# Patient Record
Sex: Male | Born: 1935 | Race: White | Hispanic: No | Smoking: Never smoker
Health system: Southern US, Community
[De-identification: ages and names within clinical notes are randomized; demographics above are authoritative.]

---

## 2019-04-27 ENCOUNTER — Inpatient Hospital Stay (HOSPITAL_COMMUNITY): Payer: Self-pay

## 2019-04-27 ENCOUNTER — Inpatient Hospital Stay (HOSPITAL_COMMUNITY): Payer: No Typology Code available for payment source

## 2019-04-27 ENCOUNTER — Inpatient Hospital Stay (HOSPITAL_COMMUNITY)
Admission: AD | Admit: 2019-04-27 | Discharge: 2019-05-13 | DRG: 291 | Disposition: A | Discharge status: Skilled Nursing Facility | Payer: No Typology Code available for payment source | Source: Other Acute Inpatient Hospital | Attending: Internal Medicine | Admitting: Internal Medicine

## 2019-04-27 DIAGNOSIS — Z952 Presence of prosthetic heart valve: Secondary | ICD-10-CM | POA: Diagnosis not present

## 2019-04-27 DIAGNOSIS — B948 Sequelae of other specified infectious and parasitic diseases: Secondary | ICD-10-CM | POA: Diagnosis not present

## 2019-04-27 DIAGNOSIS — I130 Hypertensive heart and chronic kidney disease with heart failure and stage 1 through stage 4 chronic kidney disease, or unspecified chronic kidney disease: Principal | ICD-10-CM | POA: Diagnosis present

## 2019-04-27 DIAGNOSIS — R630 Anorexia: Secondary | ICD-10-CM | POA: Diagnosis not present

## 2019-04-27 DIAGNOSIS — Z9181 History of falling: Secondary | ICD-10-CM | POA: Diagnosis not present

## 2019-04-27 DIAGNOSIS — Z6823 Body mass index (BMI) 23.0-23.9, adult: Secondary | ICD-10-CM

## 2019-04-27 DIAGNOSIS — U071 COVID-19: Secondary | ICD-10-CM | POA: Diagnosis present

## 2019-04-27 DIAGNOSIS — I5033 Acute on chronic diastolic (congestive) heart failure: Secondary | ICD-10-CM | POA: Diagnosis present

## 2019-04-27 DIAGNOSIS — Z789 Other specified health status: Secondary | ICD-10-CM | POA: Diagnosis not present

## 2019-04-27 DIAGNOSIS — F0390 Unspecified dementia without behavioral disturbance: Secondary | ICD-10-CM | POA: Diagnosis present

## 2019-04-27 DIAGNOSIS — I2510 Atherosclerotic heart disease of native coronary artery without angina pectoris: Secondary | ICD-10-CM | POA: Diagnosis present

## 2019-04-27 DIAGNOSIS — I350 Nonrheumatic aortic (valve) stenosis: Secondary | ICD-10-CM | POA: Diagnosis not present

## 2019-04-27 DIAGNOSIS — J1282 Pneumonia due to coronavirus disease 2019: Secondary | ICD-10-CM | POA: Diagnosis present

## 2019-04-27 DIAGNOSIS — Z79899 Other long term (current) drug therapy: Secondary | ICD-10-CM | POA: Diagnosis not present

## 2019-04-27 DIAGNOSIS — E7800 Pure hypercholesterolemia, unspecified: Secondary | ICD-10-CM | POA: Diagnosis present

## 2019-04-27 DIAGNOSIS — K8010 Calculus of gallbladder with chronic cholecystitis without obstruction: Secondary | ICD-10-CM | POA: Diagnosis present

## 2019-04-27 DIAGNOSIS — Z7901 Long term (current) use of anticoagulants: Secondary | ICD-10-CM

## 2019-04-27 DIAGNOSIS — Z7982 Long term (current) use of aspirin: Secondary | ICD-10-CM | POA: Diagnosis not present

## 2019-04-27 DIAGNOSIS — I482 Chronic atrial fibrillation, unspecified: Secondary | ICD-10-CM | POA: Diagnosis present

## 2019-04-27 DIAGNOSIS — M1990 Unspecified osteoarthritis, unspecified site: Secondary | ICD-10-CM | POA: Diagnosis present

## 2019-04-27 DIAGNOSIS — Z66 Do not resuscitate: Secondary | ICD-10-CM | POA: Diagnosis present

## 2019-04-27 DIAGNOSIS — E785 Hyperlipidemia, unspecified: Secondary | ICD-10-CM | POA: Diagnosis present

## 2019-04-27 DIAGNOSIS — N1831 Chronic kidney disease, stage 3a: Secondary | ICD-10-CM | POA: Diagnosis present

## 2019-04-27 DIAGNOSIS — E876 Hypokalemia: Secondary | ICD-10-CM | POA: Diagnosis not present

## 2019-04-27 DIAGNOSIS — I509 Heart failure, unspecified: Secondary | ICD-10-CM | POA: Diagnosis not present

## 2019-04-27 DIAGNOSIS — K8020 Calculus of gallbladder without cholecystitis without obstruction: Secondary | ICD-10-CM | POA: Diagnosis not present

## 2019-04-27 DIAGNOSIS — J1289 Other viral pneumonia: Secondary | ICD-10-CM | POA: Diagnosis not present

## 2019-04-27 DIAGNOSIS — N183 Chronic kidney disease, stage 3 unspecified: Secondary | ICD-10-CM | POA: Diagnosis not present

## 2019-04-27 DIAGNOSIS — I50811 Acute right heart failure: Secondary | ICD-10-CM

## 2019-04-27 DIAGNOSIS — R0600 Dyspnea, unspecified: Secondary | ICD-10-CM

## 2019-04-27 DIAGNOSIS — R627 Adult failure to thrive: Secondary | ICD-10-CM | POA: Diagnosis present

## 2019-04-27 DIAGNOSIS — Z79891 Long term (current) use of opiate analgesic: Secondary | ICD-10-CM | POA: Diagnosis not present

## 2019-04-27 DIAGNOSIS — N179 Acute kidney failure, unspecified: Secondary | ICD-10-CM | POA: Diagnosis not present

## 2019-04-27 DIAGNOSIS — R0602 Shortness of breath: Secondary | ICD-10-CM | POA: Diagnosis not present

## 2019-04-27 LAB — CBC
HCT: 33 % — ABNORMAL LOW (ref 39.0–52.0)
Hemoglobin: 10.6 g/dL — ABNORMAL LOW (ref 13.0–17.0)
MCH: 29.8 pg (ref 26.0–34.0)
MCHC: 32.1 g/dL (ref 30.0–36.0)
MCV: 92.7 fL (ref 80.0–100.0)
Platelets: 309 10*3/uL (ref 150–400)
RBC: 3.56 MIL/uL — ABNORMAL LOW (ref 4.22–5.81)
RDW: 15 % (ref 11.5–15.5)
WBC: 15.8 10*3/uL — ABNORMAL HIGH (ref 4.0–10.5)
nRBC: 0 % (ref 0.0–0.2)

## 2019-04-27 LAB — COMPREHENSIVE METABOLIC PANEL
ALT: 36 U/L (ref 0–44)
AST: 37 U/L (ref 15–41)
Albumin: 2.6 g/dL — ABNORMAL LOW (ref 3.5–5.0)
Alkaline Phosphatase: 198 U/L — ABNORMAL HIGH (ref 38–126)
Anion gap: 10 (ref 5–15)
BUN: 17 mg/dL (ref 8–23)
CO2: 22 mmol/L (ref 22–32)
Calcium: 8.8 mg/dL — ABNORMAL LOW (ref 8.9–10.3)
Chloride: 108 mmol/L (ref 98–111)
Creatinine, Ser: 1.18 mg/dL (ref 0.61–1.24)
GFR calc Af Amer: >60 mL/min (ref >60)
GFR calc non Af Amer: 54 mL/min — ABNORMAL LOW (ref >60)
Glucose, Bld: 128 mg/dL — ABNORMAL HIGH (ref 70–99)
Potassium: 3.8 mmol/L (ref 3.5–5.1)
Sodium: 140 mmol/L (ref 135–145)
Total Bilirubin: 1 mg/dL (ref 0.3–1.2)
Total Protein: 7.7 g/dL (ref 6.5–8.1)

## 2019-04-27 LAB — D-DIMER, QUANTITATIVE: D-Dimer, Quant: 3.89 ug/mL-FEU — ABNORMAL HIGH (ref 0.00–0.50)

## 2019-04-27 LAB — ABO/RH: ABO/RH(D): O POS

## 2019-04-27 LAB — C-REACTIVE PROTEIN: CRP: 11.1 mg/dL — ABNORMAL HIGH (ref <1.0)

## 2019-04-27 LAB — PROTIME-INR
INR: 2.6 — ABNORMAL HIGH (ref 0.8–1.2)
Prothrombin Time: 27.6 seconds — ABNORMAL HIGH (ref 11.4–15.2)

## 2019-04-27 LAB — FERRITIN: Ferritin: 489 ng/mL — ABNORMAL HIGH (ref 24–336)

## 2019-04-27 MED ORDER — NITROGLYCERIN 0.4 MG SL SUBL
0.40 | SUBLINGUAL_TABLET | SUBLINGUAL | Status: DC
Start: ? — End: 2019-04-27

## 2019-04-27 MED ORDER — ONDANSETRON HCL 4 MG/2ML IJ SOLN
4.00 | INTRAMUSCULAR | Status: DC
Start: ? — End: 2019-04-27

## 2019-04-27 MED ORDER — PANTOPRAZOLE SODIUM 40 MG PO TBEC
40.0000 mg | DELAYED_RELEASE_TABLET | Freq: Every day | ORAL | Status: DC
  Administered 2019-04-27 – 2019-05-12 (×14): 40 mg via ORAL
  Filled 2019-04-27 (×18): qty 1

## 2019-04-27 MED ORDER — DOCUSATE SODIUM 100 MG PO CAPS
100.00 | ORAL_CAPSULE | ORAL | Status: DC
Start: ? — End: 2019-04-27

## 2019-04-27 MED ORDER — METOPROLOL SUCCINATE ER 50 MG PO TB24
50.00 | ORAL_TABLET | ORAL | Status: DC
Start: 2019-04-26 — End: 2019-04-27

## 2019-04-27 MED ORDER — ALBUTEROL SULFATE HFA 108 (90 BASE) MCG/ACT IN AERS
2.00 | INHALATION_SPRAY | RESPIRATORY_TRACT | Status: DC
Start: ? — End: 2019-04-27

## 2019-04-27 MED ORDER — NAPHAZOLINE HCL OP
2.00 | OPHTHALMIC | Status: DC
Start: ? — End: 2019-04-27

## 2019-04-27 MED ORDER — OXYCODONE-ACETAMINOPHEN 5-325 MG PO TABS
1.00 | ORAL_TABLET | ORAL | Status: DC
Start: ? — End: 2019-04-27

## 2019-04-27 MED ORDER — PANTOPRAZOLE SODIUM 40 MG IV SOLR
40.00 | INTRAVENOUS | Status: DC
Start: 2019-04-25 — End: 2019-04-27

## 2019-04-27 MED ORDER — GABAPENTIN 100 MG PO CAPS
200.00 | ORAL_CAPSULE | ORAL | Status: DC
Start: 2019-04-25 — End: 2019-04-27

## 2019-04-27 MED ORDER — HYDROCOD POLST-CPM POLST ER 10-8 MG/5ML PO SUER
5.0000 mL | Freq: Two times a day (BID) | ORAL | Status: DC
  Administered 2019-04-27 – 2019-05-09 (×16): 5 mL via ORAL
  Filled 2019-04-27 (×22): qty 5

## 2019-04-27 MED ORDER — GENERIC EXTERNAL MEDICATION
500.00 | Status: DC
Start: 2019-04-25 — End: 2019-04-27

## 2019-04-27 MED ORDER — GUAIFENESIN-DM 100-10 MG/5ML PO SYRP: 10.0000 mL | ORAL_SOLUTION | ORAL | Status: DC | PRN

## 2019-04-27 MED ORDER — ZINC SULFATE 220 (50 ZN) MG PO CAPS
220.0000 mg | ORAL_CAPSULE | Freq: Every day | ORAL | Status: DC
  Administered 2019-04-27 – 2019-05-12 (×15): 220 mg via ORAL
  Filled 2019-04-27 (×18): qty 1

## 2019-04-27 MED ORDER — ROSUVASTATIN CALCIUM 20 MG PO TABS
20.0000 mg | ORAL_TABLET | Freq: Every day | ORAL | Status: DC
  Administered 2019-04-27 – 2019-05-12 (×16): 20 mg via ORAL
  Filled 2019-04-27 (×9): qty 1
  Filled 2019-04-27: qty 4
  Filled 2019-04-27 (×7): qty 1

## 2019-04-27 MED ORDER — ONDANSETRON HCL 4 MG/2ML IJ SOLN: 4.0000 mg | Freq: Four times a day (QID) | INTRAMUSCULAR | Status: DC | PRN

## 2019-04-27 MED ORDER — VITAMIN C 500 MG PO TABS
500.0000 mg | ORAL_TABLET | Freq: Every day | ORAL | Status: DC
  Administered 2019-04-27 – 2019-05-13 (×16): 500 mg via ORAL
  Filled 2019-04-27 (×18): qty 1

## 2019-04-27 MED ORDER — GENERIC EXTERNAL MEDICATION
1.00 | Status: DC
Start: 2019-04-26 — End: 2019-04-27

## 2019-04-27 MED ORDER — HYDROCODONE-ACETAMINOPHEN 5-325 MG PO TABS
1.00 | ORAL_TABLET | ORAL | Status: DC
Start: ? — End: 2019-04-27

## 2019-04-27 MED ORDER — ARIPIPRAZOLE 10 MG PO TABS
10.00 | ORAL_TABLET | ORAL | Status: DC
Start: 2019-04-25 — End: 2019-04-27

## 2019-04-27 MED ORDER — ENOXAPARIN SODIUM 40 MG/0.4ML ~~LOC~~ SOLN: 40.0000 mg | SUBCUTANEOUS | Status: DC

## 2019-04-27 MED ORDER — ACETAMINOPHEN 325 MG PO TABS
650.0000 mg | ORAL_TABLET | Freq: Four times a day (QID) | ORAL | Status: DC | PRN
  Administered 2019-04-30 – 2019-05-11 (×5): 650 mg via ORAL
  Filled 2019-04-27 (×6): qty 2

## 2019-04-27 MED ORDER — ASPIRIN EC 81 MG PO TBEC
81.0000 mg | DELAYED_RELEASE_TABLET | Freq: Every day | ORAL | Status: DC
  Administered 2019-04-27 – 2019-05-13 (×16): 81 mg via ORAL
  Filled 2019-04-27 (×18): qty 1

## 2019-04-27 MED ORDER — ASPIRIN EC 81 MG PO TBEC
81.00 | DELAYED_RELEASE_TABLET | ORAL | Status: DC
Start: 2019-04-26 — End: 2019-04-27

## 2019-04-27 MED ORDER — ARIPIPRAZOLE 10 MG PO TABS
10.0000 mg | ORAL_TABLET | Freq: Every day | ORAL | Status: DC
  Administered 2019-04-27 – 2019-05-12 (×16): 10 mg via ORAL
  Filled 2019-04-27 (×17): qty 1

## 2019-04-27 MED ORDER — WARFARIN SODIUM 5 MG PO TABS
5.00 | ORAL_TABLET | ORAL | Status: DC
Start: 2019-04-25 — End: 2019-04-27

## 2019-04-27 MED ORDER — ACETAMINOPHEN 650 MG RE SUPP: 650.0000 mg | Freq: Four times a day (QID) | RECTAL | Status: DC | PRN

## 2019-04-27 MED ORDER — METOPROLOL SUCCINATE ER 25 MG PO TB24
75.0000 mg | ORAL_TABLET | Freq: Every day | ORAL | Status: DC
  Administered 2019-04-28 – 2019-05-13 (×15): 75 mg via ORAL
  Filled 2019-04-27 (×19): qty 3

## 2019-04-27 MED ORDER — FUROSEMIDE 10 MG/ML IJ SOLN
40.0000 mg | Freq: Two times a day (BID) | INTRAMUSCULAR | Status: DC
  Administered 2019-04-27 – 2019-04-28 (×2): 40 mg via INTRAVENOUS
  Filled 2019-04-27 (×2): qty 4

## 2019-04-27 MED ORDER — ONDANSETRON HCL 4 MG PO TABS: 4.0000 mg | ORAL_TABLET | Freq: Four times a day (QID) | ORAL | Status: DC | PRN

## 2019-04-27 MED ORDER — ACETAMINOPHEN 325 MG PO TABS
650.00 | ORAL_TABLET | ORAL | Status: DC
Start: ? — End: 2019-04-27

## 2019-04-27 MED ORDER — ONDANSETRON HCL 4 MG PO TABS
4.00 | ORAL_TABLET | ORAL | Status: DC
Start: ? — End: 2019-04-27

## 2019-04-27 MED ORDER — CLONIDINE HCL 0.1 MG PO TABS
0.10 | ORAL_TABLET | ORAL | Status: DC
Start: ? — End: 2019-04-27

## 2019-04-27 MED ORDER — SACCHAROMYCES BOULARDII 250 MG PO CAPS
250.00 | ORAL_CAPSULE | ORAL | Status: DC
Start: 2019-04-25 — End: 2019-04-27

## 2019-04-27 MED ORDER — ZINC SULFATE 220 (50 ZN) MG PO CAPS
220.00 | ORAL_CAPSULE | ORAL | Status: DC
Start: 2019-04-25 — End: 2019-04-27

## 2019-04-27 MED ORDER — DEXAMETHASONE 6 MG PO TABS: 6.0000 mg | ORAL_TABLET | ORAL | Status: DC

## 2019-04-27 NOTE — H&P (Signed)
History and Physical    Hartford Maulden UMP:[id number] DOB: 08-Jul-1926 DOA: 04/27/2019  PCP: Melony Overly, MD   Patient coming from: [facility] ED  Chief Complaint:  Dyspnea.   HPI: Marcus Quinn is a 85 y.o. male with medical history significant of chronic atrial fibrillation, dyslipidemia, coronary artery disease, and aortic stenosis status post TAVR.  He was diagnosed with COVID-09 February 2019, apparently patient was admitted to the hospital and discharged to skilled nursing facility.  He reports being at home for about a week and a half, feeling fatigued and very tired.  Positive dyspnea.  He usually uses a cane for ambulation.  He lives alone, noted progressive and rapid decline in his functional physical capacity.  Home health agency evaluated him on the day of admission found him very weak and deconditioned and refer him to the hospital for further evaluation. On direct questioning patient admits having moderate dyspnea, persistent, worse with exertion, no improving factors, associated with lower extremity edema and decreased physical functional capacity.  Denies any fevers and chills.  ED Course: Further work-up in the emergency department revealed oxygen saturation 80% on room air, CT angiography of his chest negative for pulmonary embolism or significant infiltrates, positive leukocytosis and positive SARS COVID-19 PCR  Review of Systems:  1. General: No fevers, no chills, no weight gain or weight loss 2. ENT: No runny nose or sore throat, no hearing disturbances 3. Pulmonary: positive dyspnea, but no cough, wheezing, or hemoptysis 4. Cardiovascular: No angina, claudication, positive lower extremity edema, but no pnd or orthopnea 5. Gastrointestinal: No nausea or vomiting, no diarrhea or constipation 6. Hematology: No easy bruisability or frequent infections 7. Urology: No dysuria, hematuria or increased urinary frequency 8. Dermatology: No rashes. 9. Neurology: No seizures or  paresthesias, generalized weakness and ambulatory dysfunction.  10. Musculoskeletal: No joint pain or deformities   Past Medical and Concurrent History Cardiac History: Reports: Coronary Artery Disease, Atrial Fibrillation, Hypertension, Hypercholesterolemia, Valvular Heart Disease.  Denies: Syncope Respiratory History: Denies: Asthma, COPD, Pulmonary Embolism GI/GU History: Reports: Gastroesophageal Reflux.  Denies: Renal Disease, Kidney Stones, Ulcer, IBD Musculoskeletal History: Reports: Osteoarthritis Neurological History: Denies: Cerebrovascular Accident, Seizures, Migraine Psychological History: Denies: Depression    Prior to Admission medications   Not on File    Physical Exam: There were no vitals filed for this visit.  There were no vitals filed for this visit. General: deconditioned  Neurology: Awake and alert, non focal Head and Neck. Head normocephalic. Neck supple with no adenopathy or thyromegaly.   E ENT: mild pallor, no icterus, oral mucosa moist Cardiovascular: No JVD. S1-S2 present, irregularly irregular, no gallops, rubs, or murmurs. ++/+++ pitting lower extremity edema. Pulmonary: positive breath sounds bilaterally. Gastrointestinal. Abdomen flat, no organomegaly, non tender, no rebound or guarding Skin. No rashes Musculoskeletal: no joint deformities    Labs on Admission: I have personally reviewed following labs and imaging studies  CBC: No results for input(s): WBC, NEUTROABS, HGB, HCT, MCV, PLT in the last 168 hours. Basic Metabolic Panel: No results for input(s): NA, K, CL, CO2, GLUCOSE, BUN, CREATININE, CALCIUM, MG, PHOS in the last 168 hours. GFR: CrCl cannot be calculated (No successful lab value found.). Liver Function Tests: No results for input(s): AST, ALT, ALKPHOS, BILITOT, PROT, ALBUMIN in the last 168 hours. No results for input(s): LIPASE, AMYLASE in the last 168 hours. No results for input(s): AMMONIA in the last 168 hours.  Coagulation Profile: No results for input(s): INR, PROTIME in the last 168 hours.  Cardiac Enzymes: No results for input(s): CKTOTAL, CKMB, CKMBINDEX, TROPONINI in the last 168 hours. BNP (last 3 results) No results for input(s): PROBNP in the last 8760 hours. HbA1C: No results for input(s): HGBA1C in the last 72 hours. CBG: No results for input(s): GLUCAP in the last 168 hours. Lipid Profile: No results for input(s): CHOL, HDL, LDLCALC, TRIG, CHOLHDL, LDLDIRECT in the last 72 hours. Thyroid Function Tests: No results for input(s): TSH, T4TOTAL, FREET4, T3FREE, THYROIDAB in the last 72 hours. Anemia Panel: No results for input(s): VITAMINB12, FOLATE, FERRITIN, TIBC, IRON, RETICCTPCT in the last 72 hours. Urine analysis: No results found for: COLORURINE, APPEARANCEUR, LABSPEC, PHURINE, GLUCOSEU, HGBUR, BILIRUBINUR, KETONESUR, PROTEINUR, UROBILINOGEN, NITRITE, LEUKOCYTESUR  Radiological Exams on Admission: No results found.  EKG: Independently reviewed. NA    Home Medications  Aripiprazole [Abilify] 10 mg PO HS 02/11/19 [Confirmed 02/11/19 Last Taken 02/10/19] Aspirin Mendel Ryder Aspirin] 81 mg PO QAM 02/11/19 [Confirmed 02/11/19 Last Taken 02/11/19] Famotidine [Pepcid] 40 mg PO DAILY #30 tablet 02/11/19 [Last Taken Unknown] Furosemide 20 mg PO QAM 02/11/19 [Confirmed 02/11/19 Last Taken 02/11/19] Gabapentin 300 mg PO TID 02/11/19 [Confirmed 02/11/19 Last Taken 02/11/19] Metoprolol Succinate 75 mg PO QAM 02/11/19 [Confirmed 02/11/19 Last Taken 02/11/19] Multivitamin [Daily Multiple Vitamin] 1 tab PO DAILY 02/11/19 [Confirmed 02/11/19 Last Taken 02/11/19] Pantoprazole Sodium 40 mg PO DAILY 02/11/19 [Confirmed 02/11/19 Last Taken 02/11/19] Rosuvastatin Calcium 20 mg PO HS 02/11/19 [Confirmed 02/11/19 Last Taken 02/11/19] Sennosides/Docusate Sodium [Senna-Docusate Sodium Tablet] 2 tab PO HS PRN 02/11/19 [Confirmed 02/11/19 Last Taken Unknown] Warfarin Sodium [Coumadin] 5 mg PO  .QEVENING 02/11/19 [Confirmed 02/11/19 Last Taken 02/10/19  Family history was reviewed and found to be non contributory.   Assessment/Plan Principal Problem:   Acute CHF (congestive heart failure) (HCC) Active Problems:   Pneumonia due to COVID-19 virus   Atrial fibrillation, chronic (HCC)   Aortic stenosis  85 year old male past medical history of chronic atrial fibrillation, aortic stenosis (TAVR), dyslipidemia, coronary artery disease.  Recently diagnosed with COVID-09 February 2019, apparently he has been at a skilled nursing facility and discharged posteriorly home.  At his home he lives alone, he experienced a rapid decline in his physical functional capacity associated with dyspnea and lower extremity edema.  No fevers no chills, no cough.  Patient tested for positive for COVID-19 again today and was transferred to [facility], at the time of transfer his temperature 98.5, blood pressure 136/86, heart rate 98, respiratory rate 25, oxygen saturation 97% on room air.  His lungs had no wheezing or rales, heart S1-S2 present, irregularly irregular, abdomen soft, positive lower extremity edema.  Sodium 143, potassium 4.1, chloride 110, bicarb 25, BUN 20, creatinine 1.20, glucose 140, AST 47, ALT 39, alkaline phosphatase 249, total bilirubin is 1.0, troponin 0 0.06, procalcitonin 0.56. White cell count 12.9, hemoglobin 10.6, hematocrit 30.8, platelets 278. CT chest with bilateral pleural effusions,) left.  Abnormal appearance of the gallbladder and gallbladder..  Possible acute cholecystitis.  Patient was admitted to the hospital with working diagnosis right heart failure, acute on chronic, in the setting of positive COVID-19 PCR.   1.  Acute on chronic diastolic heart failure with cardiogenic pulmonary edema and bilateral pleural effusions.  Patient will be admitted to the telemetry ward, continue diuresis with furosemide 40 g intravenously every 12 hours, target negative fluid balance.  Continue  beta-blockade with metoprolol succinate.  Follow-up on echocardiography report.  2.  COVID 19 infection. Patient currently has no symptoms of COVID viral pneumonia, he tested positive  for the first time in 08/20, after treatment he was retested and resulted negative, now on current encounter he has tested positive again. Will continue respiratory isolation for now.   3. Dyslipidemia. Continue with statin therapy.   4. Chronic atrial fibrillation. Currently rate control, continue metoprolol and anticoagulation with warfarin.   5. Dementia. Continue with ariprazole.    6. Abnormal gallbladder. Will follow up with ruq US. Patient with no abdominal pain, no nausea or vomiting.    DVT prophylaxis:  Warfarin  Code Status: full  Family Communication: no family at the bedside   Disposition Plan:  Telemetry   Consults called: none   Admission status: Inpatient.     Marcus Annett Gulaaniel Arrien MD [facility]   04/27/2019, 3:02 PM

## 2019-04-27 NOTE — Progress Notes (Addendum)
ANTICOAGULATION CONSULT NOTE - Initial Consult  Pharmacy Consult for Warfarin Indication: atrial fibrillation  Not on File  Patient Measurements:   Heparin Dosing Weight:   Vital Signs: Temp: 98.5 F (36.9 C) (11/04 1500) Temp Source: Oral (11/04 1500) BP: 136/86 (11/04 1500) Pulse Rate: 98 (11/04 1500)  Labs: No results for input(s): HGB, HCT, PLT, APTT, LABPROT, INR, HEPARINUNFRC, HEPRLOWMOCWT, CREATININE, CKTOTAL, CKMB, TROPONINIHS in the last 72 hours.  CrCl cannot be calculated (No successful lab value found.).   Medical History: No past medical history on file.  Medications:  Scheduled:  . ARIPiprazole  10 mg Oral QHS  . aspirin EC  81 mg Oral Daily  . chlorpheniramine-HYDROcodone  5 mL Oral Q12H  . enoxaparin (LOVENOX) injection  40 mg Subcutaneous Q24H  . furosemide  40 mg Intravenous Q12H  . metoprolol succinate  75 mg Oral Daily  . pantoprazole  40 mg Oral Daily  . rosuvastatin  20 mg Oral q1800  . vitamin C  500 mg Oral Daily  . zinc sulfate  220 mg Oral Daily   Infusions:    Assessment: 89 yoM admitted to [place] from [facility] with +COVID-19.  PMH is significant for chronic Afib on warfarin, dyslipidemia, CAD, andaortic stenosis s/p TAVR.  Pharmacy has been consulted to continue warfarin dosing.  Med rec is pending for home dose. INR yesterday at [facility] was 2.3, INR today is pending. CBC from [facility] 11/3:  Hgb 10.6, Plt Count 278 No bleeding or complications reported.  No major drug-drug interactions identified.   Goal of Therapy:  INR 2-3 Monitor platelets by anticoagulation protocol: Yes   Plan:  Warfarin dose pending updated information. Daily PT/INR. Monitor for signs and symptoms of bleeding.  Gretta Arab PharmD, BCPS Clinical pharmacist phone 7am- 5pm: [phone] 04/27/2019 4:54 PM

## 2019-04-28 ENCOUNTER — Encounter (HOSPITAL_COMMUNITY): Payer: Self-pay

## 2019-04-28 ENCOUNTER — Inpatient Hospital Stay (HOSPITAL_COMMUNITY): Payer: No Typology Code available for payment source

## 2019-04-28 DIAGNOSIS — R0602 Shortness of breath: Secondary | ICD-10-CM

## 2019-04-28 LAB — COMPREHENSIVE METABOLIC PANEL
ALT: 35 U/L (ref 0–44)
AST: 42 U/L — ABNORMAL HIGH (ref 15–41)
Albumin: 2.7 g/dL — ABNORMAL LOW (ref 3.5–5.0)
Alkaline Phosphatase: 193 U/L — ABNORMAL HIGH (ref 38–126)
Anion gap: 12 (ref 5–15)
BUN: 19 mg/dL (ref 8–23)
CO2: 24 mmol/L (ref 22–32)
Calcium: 8.6 mg/dL — ABNORMAL LOW (ref 8.9–10.3)
Chloride: 105 mmol/L (ref 98–111)
Creatinine, Ser: 1.29 mg/dL — ABNORMAL HIGH (ref 0.61–1.24)
GFR calc Af Amer: 56 mL/min — ABNORMAL LOW (ref >60)
GFR calc non Af Amer: 48 mL/min — ABNORMAL LOW (ref >60)
Glucose, Bld: 104 mg/dL — ABNORMAL HIGH (ref 70–99)
Potassium: 3.3 mmol/L — ABNORMAL LOW (ref 3.5–5.1)
Sodium: 141 mmol/L (ref 135–145)
Total Bilirubin: 1.7 mg/dL — ABNORMAL HIGH (ref 0.3–1.2)
Total Protein: 7.3 g/dL (ref 6.5–8.1)

## 2019-04-28 LAB — C-REACTIVE PROTEIN: CRP: 10.2 mg/dL — ABNORMAL HIGH (ref <1.0)

## 2019-04-28 LAB — ECHOCARDIOGRAM COMPLETE

## 2019-04-28 LAB — PROTIME-INR
INR: 2.6 — ABNORMAL HIGH (ref 0.8–1.2)
Prothrombin Time: 27.5 seconds — ABNORMAL HIGH (ref 11.4–15.2)

## 2019-04-28 LAB — D-DIMER, QUANTITATIVE: D-Dimer, Quant: 6.7 ug/mL-FEU — ABNORMAL HIGH (ref 0.00–0.50)

## 2019-04-28 LAB — FERRITIN: Ferritin: 480 ng/mL — ABNORMAL HIGH (ref 24–336)

## 2019-04-28 MED ORDER — WARFARIN SODIUM 3 MG PO TABS
3.0000 mg | ORAL_TABLET | Freq: Once | ORAL | Status: AC
  Administered 2019-04-28: 3 mg via ORAL
  Filled 2019-04-28: qty 1

## 2019-04-28 MED ORDER — WARFARIN - PHARMACIST DOSING INPATIENT
Freq: Every day | Status: DC
  Administered 2019-05-05 – 2019-05-09 (×5)

## 2019-04-28 NOTE — Progress Notes (Signed)
ANTICOAGULATION CONSULT NOTE - Follow Up Consult  Pharmacy Consult for Warfarin Indication: atrial fibrillation  No Known Allergies  Patient Measurements:    Vital Signs: Temp: 97.7 F (36.5 C) (11/05 1100) Temp Source: Oral (11/05 1100) BP: 135/91 (11/05 1100) Pulse Rate: 100 (11/05 0850)  Labs: Recent Labs    04/27/19 1600 04/28/19 0600  HGB 10.6*  --   HCT 33.0*  --   PLT 309  --   LABPROT 27.6* 27.5*  INR 2.6* 2.6*  CREATININE 1.18 1.29*    CrCl cannot be calculated (Unknown ideal weight.).   Medications:  Scheduled:  . ARIPiprazole  10 mg Oral QHS  . aspirin EC  81 mg Oral Daily  . chlorpheniramine-HYDROcodone  5 mL Oral Q12H  . metoprolol succinate  75 mg Oral Daily  . pantoprazole  40 mg Oral Daily  . rosuvastatin  20 mg Oral q1800  . vitamin C  500 mg Oral Daily  . zinc sulfate  220 mg Oral Daily   Infusions:    Assessment: Marcus Quinn admitted to [place] from [facility] with +COVID-19.  PMH is significant for chronic Afib on warfarin, dyslipidemia, CAD, andaortic stenosis s/p TAVR.  Pharmacy has been consulted to continue warfarin dosing.  PTA warfarin dose is 5mg  daily.  INR 2.6 CBC: Hgb 10.6, Plt wnl. No bleeding or complications reported.  No major drug-drug interactions identified. D-dimer 3.89 > 6.7  Goal of Therapy:  INR 2-3 Monitor platelets by anticoagulation protocol: Yes   Plan:  Warfarin 3 mg PO x 1. Daily PT/INR. Monitor for signs and symptoms of bleeding.   Gretta Arab PharmD, BCPS Clinical pharmacist phone 7am- 5pm: [phone] 04/28/2019 12:17 PM

## 2019-04-28 NOTE — Progress Notes (Signed)
  Echocardiogram 2D Echocardiogram has been performed.  Marcus Quinn 04/28/2019, 11:25 AM

## 2019-04-28 NOTE — Progress Notes (Signed)
Nurse inquired if wanted to call daughter and update her and he stated no he did not want for nurse to do this at this time.

## 2019-04-28 NOTE — Progress Notes (Signed)
PROGRESS NOTE    Marcus Quinn  VQQ:[id number] DOB: Jan 08, 1927 DOA: 04/27/2019 PCP: Melony Overly, MD    Brief Narrative:  85 year old male past medical history of chronic atrial fibrillation, aortic stenosis (TAVR), dyslipidemia, coronary artery disease.  Recently diagnosed with COVID-09 February 2019, apparently he has been at a skilled nursing facility and discharged posteriorly home.  At his home he lives alone, he experienced a rapid decline in his physical functional capacity associated with dyspnea and lower extremity edema.  No fevers no chills, no cough.  Patient tested for positive for COVID-19 again today and was transferred to [facility], at the time of transfer his temperature 98.5, blood pressure 136/86, heart rate 98, respiratory rate 25, oxygen saturation 97% on room air.  His lungs had no wheezing or rales, heart S1-S2 present, irregularly irregular, abdomen soft, positive lower extremity edema.  Sodium 143, potassium 4.1, chloride 110, bicarb 25, BUN 20, creatinine 1.20, glucose 140, AST 47, ALT 39, alkaline phosphatase 249, total bilirubin is 1.0, troponin 0 0.06, procalcitonin 0.56. White cell count 12.9, hemoglobin 10.6, hematocrit 30.8, platelets 278. CT chest with bilateral pleural effusions,) left.  Abnormal appearance of the gallbladder and gallbladder..  Possible acute cholecystitis.  Patient was admitted to the hospital with working diagnosis right heart failure, acute on chronic, in the setting of positive COVID-19 PCR.    Assessment & Plan:   Principal Problem:   Acute CHF (congestive heart failure) (HCC) Active Problems:   Pneumonia due to COVID-19 virus   Atrial fibrillation, chronic (HCC)   Aortic stenosis   1.  Acute on chronic diastolic heart failure with cardiogenic pulmonary edema and bilateral pleural effusions. Chest film personally reviewed noted no significant edema or pleural effusions, fluid balance is negative 2,155 ml over last 24 H. Dyspnea has improved,  with oxymetry at 97% on room air. Will hold on furosemide today and will continue to follow symptoms. Out of bed with physical therapy. Continue with metoprolol and follow with echocardiogram.   2.  COVID 19 infection. Initial positive PCR on 08/20. No clinical signs of viral pneumonia. Elevated D dimer 6,7 but CT scan at [facility] negative for PE. Patient on warfarin for atrial fibrillation. INR is therapeutic.   3. Dyslipidemia. On statin therapy with rosuvastatin.    4. Chronic atrial fibrillation. On metoprolol for rate control and  anticoagulation with warfarin.   5. Dementia. On ariprazole.    6. Abnormal gallbladder. Pending [place] right upper quadrant, but patient with no abdominal pain.    DVT prophylaxis: warfarin   Code Status: full Family Communication: no family at the bedside  Disposition Plan/ discharge barriers: possible dc home in am, pending physical therapy evaluation.   There is no height or weight on file to calculate BMI. Malnutrition Type:      Malnutrition Characteristics:      Nutrition Interventions:     RN Pressure Injury Documentation:     Consultants:     Procedures:     Antimicrobials:       Subjective: Patient is feeling better, his dyspnea has improved, no nausea or vomiting, no chest pain. Has not been out of bed yet. At home was very weak and deconditioned.   Objective: Vitals:   04/28/19 0039 04/28/19 0700 04/28/19 0802 04/28/19 0850  BP: (!) 128/95  (!) 144/87 (!) 144/87  Pulse:    100  Resp: 19  16 (!) 21  Temp: 98 F (36.7 C) (!) 97.3 F (36.3 C)    TempSrc:  Oral Oral    SpO2: 100%  97% 97%    Intake/Output Summary (Last 24 hours) at 04/28/2019 2130 Last data filed at 04/28/2019 0000 Gross per 24 hour  Intake 120 ml  Output 2275 ml  Net -2155 ml   There were no vitals filed for this visit.  Examination:   General: Not in pain or dyspnea, deconditioned  Neurology: Awake and alert, non focal  E ENT: with mild  pallor, no icterus, oral mucosa moist Cardiovascular: No JVD. S1-S2 present, rhythmic, no gallops, rubs, or murmurs. Trace lower extremity edema. Pulmonary: bilateral breath sounds bilaterally. Gastrointestinal. Abdomen with no organomegaly, non tender, no rebound or guarding Skin. No rashes Musculoskeletal: no joint deformities     Data Reviewed: I have personally reviewed following labs and imaging studies  CBC: Recent Labs  Lab 04/27/19 1600  WBC 15.8*  HGB 10.6*  HCT 33.0*  MCV 92.7  PLT 309   Basic Metabolic Panel: Recent Labs  Lab 04/27/19 1600 04/28/19 0600  NA 140 141  K 3.8 3.3*  CL 108 105  CO2 22 24  GLUCOSE 128* 104*  BUN 17 19  CREATININE 1.18 1.29*  CALCIUM 8.8* 8.6*   GFR: CrCl cannot be calculated (Unknown ideal weight.). Liver Function Tests: Recent Labs  Lab 04/27/19 1600 04/28/19 0600  AST 37 42*  ALT 36 35  ALKPHOS 198* 193*  BILITOT 1.0 1.7*  PROT 7.7 7.3  ALBUMIN 2.6* 2.7*   No results for input(s): LIPASE, AMYLASE in the last 168 hours. No results for input(s): AMMONIA in the last 168 hours. Coagulation Profile: Recent Labs  Lab 04/27/19 1600  INR 2.6*   Cardiac Enzymes: No results for input(s): CKTOTAL, CKMB, CKMBINDEX, TROPONINI in the last 168 hours. BNP (last 3 results) No results for input(s): PROBNP in the last 8760 hours. HbA1C: No results for input(s): HGBA1C in the last 72 hours. CBG: No results for input(s): GLUCAP in the last 168 hours. Lipid Profile: No results for input(s): CHOL, HDL, LDLCALC, TRIG, CHOLHDL, LDLDIRECT in the last 72 hours. Thyroid Function Tests: No results for input(s): TSH, T4TOTAL, FREET4, T3FREE, THYROIDAB in the last 72 hours. Anemia Panel: Recent Labs    04/27/19 1600 04/28/19 0600  FERRITIN 489* 480*      Radiology Studies: I have reviewed all of the imaging during this hospital visit personally     Scheduled Meds: . ARIPiprazole  10 mg Oral QHS  . aspirin EC  81 mg Oral  Daily  . chlorpheniramine-HYDROcodone  5 mL Oral Q12H  . furosemide  40 mg Intravenous Q12H  . metoprolol succinate  75 mg Oral Daily  . pantoprazole  40 mg Oral Daily  . rosuvastatin  20 mg Oral q1800  . vitamin C  500 mg Oral Daily  . zinc sulfate  220 mg Oral Daily   Continuous Infusions:   LOS: 1 day        Reylene Stauder Annett Gula, MD

## 2019-04-28 NOTE — Plan of Care (Signed)
Pt interacting with nurse but only when nurse engages patient.  Lasix BID - pt had 1175 output from 1800 11/04 dose via condom cath.  Pt had 2 bowel incontinence during night.  Pt stated he can't tell when he needs to have a BM.  Pt maintained more of a fetal position with sleep most of night.  Pt remained on RA, sats 95% or better, Afib on monitor.  VSS.

## 2019-04-29 ENCOUNTER — Other Ambulatory Visit: Payer: Self-pay

## 2019-04-29 LAB — CBC
HCT: 33.2 % — ABNORMAL LOW (ref 39.0–52.0)
Hemoglobin: 10.8 g/dL — ABNORMAL LOW (ref 13.0–17.0)
MCH: 29.8 pg (ref 26.0–34.0)
MCHC: 32.5 g/dL (ref 30.0–36.0)
MCV: 91.7 fL (ref 80.0–100.0)
Platelets: 319 10*3/uL (ref 150–400)
RBC: 3.62 MIL/uL — ABNORMAL LOW (ref 4.22–5.81)
RDW: 14.5 % (ref 11.5–15.5)
WBC: 12 10*3/uL — ABNORMAL HIGH (ref 4.0–10.5)
nRBC: 0 % (ref 0.0–0.2)

## 2019-04-29 LAB — COMPREHENSIVE METABOLIC PANEL
ALT: 32 U/L (ref 0–44)
AST: 45 U/L — ABNORMAL HIGH (ref 15–41)
Albumin: 2.8 g/dL — ABNORMAL LOW (ref 3.5–5.0)
Alkaline Phosphatase: 202 U/L — ABNORMAL HIGH (ref 38–126)
Anion gap: 14 (ref 5–15)
BUN: 19 mg/dL (ref 8–23)
CO2: 23 mmol/L (ref 22–32)
Calcium: 8.5 mg/dL — ABNORMAL LOW (ref 8.9–10.3)
Chloride: 104 mmol/L (ref 98–111)
Creatinine, Ser: 1.29 mg/dL — ABNORMAL HIGH (ref 0.61–1.24)
GFR calc Af Amer: 56 mL/min — ABNORMAL LOW (ref >60)
GFR calc non Af Amer: 48 mL/min — ABNORMAL LOW (ref >60)
Glucose, Bld: 92 mg/dL (ref 70–99)
Potassium: 3.2 mmol/L — ABNORMAL LOW (ref 3.5–5.1)
Sodium: 141 mmol/L (ref 135–145)
Total Bilirubin: 1.4 mg/dL — ABNORMAL HIGH (ref 0.3–1.2)
Total Protein: 7.6 g/dL (ref 6.5–8.1)

## 2019-04-29 LAB — PROTIME-INR
INR: 2.8 — ABNORMAL HIGH (ref 0.8–1.2)
Prothrombin Time: 28.7 seconds — ABNORMAL HIGH (ref 11.4–15.2)

## 2019-04-29 LAB — GLUCOSE, CAPILLARY: Glucose-Capillary: 120 mg/dL — ABNORMAL HIGH (ref 70–99)

## 2019-04-29 MED ORDER — FUROSEMIDE 40 MG PO TABS: 40.0000 mg | ORAL_TABLET | Freq: Every day | ORAL | 0 refills | Status: DC

## 2019-04-29 MED ORDER — ENSURE ENLIVE PO LIQD
237.0000 mL | Freq: Two times a day (BID) | ORAL | Status: DC
  Administered 2019-04-30 – 2019-05-12 (×9): 237 mL via ORAL

## 2019-04-29 MED ORDER — WARFARIN SODIUM 3 MG PO TABS
3.0000 mg | ORAL_TABLET | ORAL | Status: AC
  Administered 2019-04-29: 3 mg via ORAL
  Filled 2019-04-29: qty 1

## 2019-04-29 MED ORDER — POTASSIUM CHLORIDE ER 20 MEQ PO TBCR: 20.0000 meq | EXTENDED_RELEASE_TABLET | Freq: Every day | ORAL | 0 refills | Status: AC

## 2019-04-29 MED ORDER — POTASSIUM CHLORIDE CRYS ER 20 MEQ PO TBCR
40.0000 meq | EXTENDED_RELEASE_TABLET | Freq: Once | ORAL | Status: AC
  Administered 2019-04-29: 09:00:00 40 meq via ORAL
  Filled 2019-04-29: qty 2

## 2019-04-29 MED ORDER — ENSURE ENLIVE PO LIQD: 237.0000 mL | Freq: Two times a day (BID) | ORAL | 0 refills | Status: AC

## 2019-04-29 NOTE — Evaluation (Signed)
Physical Therapy Evaluation Patient Details Name: Marcus Quinn MRN: [id number] DOB: 1926/07/07 Today's Date: 04/29/2019   History of Present Illness  85 year old male past medical history of chronic atrial fibrillation, aortic stenosis (sp TAVR), dyslipidemia, coronary artery disease.Diagnosed with COVID-09 February 2019,had been at [facility], recently [place] home where he lives alone. Patient  experienced a rapid decline in his physical functional capacity ,dyspnea and lower extremity edema. Patient tested for positive for COVID-19 again. Admitted to [facility] from [facility] 11/4.  Clinical Impression  The patient is very sluggish, slow to respond, unable to provide any meaningful history as to prior level of function since leaving SNF for rehab. Patient demonstrates decreased initiation. Patient assisted to stand with Rw and attempted to ambulate but patient did not wish to  Attempt. Assisted to recliner. Patient is high fall risk, recommend 24/7 assist for all aspects of care. Pt admitted with above diagnosis.  Pt currently with functional limitations due to the deficits listed below (see PT Problem List). Pt will benefit from skilled PT to increase their independence and safety with mobility to allow discharge to the venue listed below.     Follow Up Recommendations SNF;Supervision/Assistance - 24 hour    Equipment Recommendations  None recommended by PT    Recommendations for Other Services       Precautions / Restrictions Precautions Precautions: Fall Precaution Comments: check catheter      Mobility  Bed Mobility Overal bed mobility: Needs Assistance Bed Mobility: Supine to Sit     Supine to sit: Min assist;HOB elevated     General bed mobility comments: use of rails, extra time and delayed in initiation to mobilize  Transfers Overall transfer level: Needs assistance Equipment used: Rolling walker (2 wheeled) Transfers: Sit to/from [facility] Sit to Stand: Mod assist Stand  pivot transfers: Mod assist       General transfer comment: multimodal cues to stand from bed,  shuffle steps to get to recliner, head forward, cues for posture, HR 110-143, irregular. SPO2 >94% on RA  Ambulation/Gait             General Gait Details: pt. stated that he was too tired to try, very sluggish  Stairs            Wheelchair Mobility    Modified Rankin (Stroke Patients Only)       Balance Overall balance assessment: Needs assistance Sitting-balance support: Feet supported;Bilateral upper extremity supported Sitting balance-Leahy Scale: Fair     Standing balance support: During functional activity;Bilateral upper extremity supported Standing balance-Leahy Scale: Poor Standing balance comment: reliant on  UE support., forward leaning                             Pertinent Vitals/Pain Pain Assessment: Faces Faces Pain Scale: Hurts a little bit Pain Location: joints when sitting down Pain Descriptors / Indicators: Discomfort Pain Intervention(s): Monitored during session    Home Living Family/patient expects to be discharged to:: Private residence Living Arrangements: Alone;Non-relatives/Friends   Type of Home: House Home Access: Stairs to enter Entrance Stairs-Rails: Psychiatric nurse of Steps: 2-3 Home Layout: One level Home Equipment: Cane - single point Additional Comments: patient could not provide all info    Prior Function           Comments: per patient, ambulated with a cane, unsure for functional level PTA     Hand Dominance   Dominant Hand: Right    Extremity/Trunk  Assessment        Lower Extremity Assessment Lower Extremity Assessment: Generalized weakness    Cervical / Trunk Assessment Cervical / Trunk Assessment: Kyphotic  Communication   Communication: (voice is very quiet)  Cognition Arousal/Alertness: Awake/alert Behavior During Therapy: Flat affect Overall Cognitive Status: No  family/caregiver present to determine baseline cognitive functioning Area of Impairment: Attention;Memory;Following commands;Safety/judgement;Awareness;Problem solving                 Orientation Level: Time;Situation Current Attention Level: Alternating Memory: Decreased short-term memory Following Commands: Follows one step commands inconsistently Safety/Judgement: Decreased awareness of deficits;Decreased awareness of safety Awareness: Intellectual Problem Solving: Slow processing;Decreased initiation;Difficulty sequencing;Requires verbal cues General Comments: oriented to hospital when given choice, and to [place]. Stated he was in [place] rehab.      General Comments      Exercises     Assessment/Plan    PT Assessment Patient needs continued PT services  PT Problem List Decreased strength;Decreased mobility;Decreased safety awareness;Decreased knowledge of precautions;Decreased activity tolerance;Decreased cognition;Cardiopulmonary status limiting activity;Decreased knowledge of use of DME;Decreased balance       PT Treatment Interventions DME instruction;Therapeutic activities;Gait training;Functional mobility training;Therapeutic exercise;Patient/family education    PT Goals (Current goals can be found in the Care Plan section)  Acute Rehab PT Goals Patient Stated Goal: agreed to getting to recliner PT Goal Formulation: Patient unable to participate in goal setting Time For Goal Achievement: 05/13/19 Potential to Achieve Goals: Fair    Frequency Min 2X/week   Barriers to discharge Decreased caregiver support      Co-evaluation PT/OT/SLP Co-Evaluation/Treatment: Yes Reason for Co-Treatment: For patient/therapist safety PT goals addressed during session: Mobility/safety with mobility OT goals addressed during session: ADL's and self-care       AM-PAC PT "6 Clicks" Mobility  Outcome Measure Help needed turning from your back to your side while in a  flat bed without using bedrails?: A Little Help needed moving from lying on your back to sitting on the side of a flat bed without using bedrails?: A Little Help needed moving to and from a bed to a chair (including a wheelchair)?: A Lot Help needed standing up from a chair using your arms (e.g., wheelchair or bedside chair)?: A Lot Help needed to walk in hospital room?: Total Help needed climbing 3-5 steps with a railing? : Total 6 Click Score: 12    End of Session Equipment Utilized During Treatment: Gait belt Activity Tolerance: Patient limited by fatigue;Patient limited by lethargy Patient left: in chair;with call bell/phone within reach;with chair alarm set Nurse Communication: Mobility status PT Visit Diagnosis: Muscle weakness (generalized) (M62.81);Unsteadiness on feet (R26.81);Difficulty in walking, not elsewhere classified (R26.2)    Time: 8182-9937 PT Time Calculation (min) (ACUTE ONLY): 34 min   Charges:   PT Evaluation $PT Eval Moderate Complexity: 1 Mod          Blanchard Kelch PT Acute Rehabilitation Services Pager [phone] Office [phone]   Rada Hay 04/29/2019, 3:05 PM

## 2019-04-29 NOTE — TOC Progression Note (Signed)
Transition of Care [facility]) - Progression Note    Patient Details  Name: Marcus Quinn MRN: [id number] Date of Birth: 1927/02/18  Transition of Care [facility] CM/SW Contact  Purcell Mouton, RN Phone Number: 04/29/2019, 2:56 PM  Clinical Narrative:    Pt is with [facility] [phone]. A call was made and VM was left for [facility], CSW for this pt at [facility].         Expected Discharge Plan and Services           Expected Discharge Date: 04/29/19                                     Social Determinants of Health (SDOH) Interventions    Readmission Risk Interventions No flowsheet data found.

## 2019-04-29 NOTE — Evaluation (Addendum)
Occupational Therapy Evaluation Patient Details Name: Marcus Quinn MRN: [id number] DOB: 01/24/1927 Today's Date: 04/29/2019    History of Present Illness 85 year old male past medical history of chronic atrial fibrillation, aortic stenosis (sp TAVR), dyslipidemia, coronary artery disease.Diagnosed with COVID-09 February 2019,[facility] where he lives alone. Patient  experienced a rapid decline in his physical functional capacity ,dyspnea and lower extremity edema. Patient tested for positive for COVID-19 again. Admitted to [facility] from [facility]11/[facility].   Clinical Impression   This 85 y/o male presents with the above. Per chart review pt with recent SNF stay and d/c home prior to this admission. Question pt full accuracy of report however he reports was living alone and having friends "check in". Pt presenting with weakness, fatigue, and impaired cognition. Pt requiring increased time to process/follow commands and respond to therapist questions. He currently requires min-modA for functional transfers using RW, unable to tolerate mobility beyond bed>chair due to weakness/fatigue. Pt currently requires maxA for LB ADL, minA for seated UB ADL. HR 110-143, irregular. SPO2 >94% on RA throughout. He will benefit from continued acute OT services and recommend follow up therapy services in SNF setting (unless able to have 24hr assist at home) after discharge to progress pt towards his PLOF. Will follow.     Follow Up Recommendations  SNF;Supervision/Assistance - 24 hour    Equipment Recommendations  Other (comment)(TBD)           Precautions / Restrictions Precautions Precautions: Fall Precaution Comments: check catheter Restrictions Weight Bearing Restrictions: No      Mobility Bed Mobility Overal bed mobility: Needs Assistance Bed Mobility: Supine to Sit     Supine to sit: Min assist;HOB elevated     General bed mobility comments: use of rails, extra time and delayed in  initiation to mobilize  Transfers Overall transfer level: Needs assistance Equipment used: Rolling walker (2 wheeled) Transfers: Sit to/from [employer]GI CorporationStand;Stand Pivot Transfers Sit to Stand: Mod assist Stand pivot transfers: Mod assist       General transfer comment: multimodal cues to stand from bed,  shuffle steps to get to recliner, head forward, cues for posture, HR 110-143, irregular. SPO2 >94% on RA    Balance Overall balance assessment: Needs assistance Sitting-balance support: Feet supported;Bilateral upper extremity supported Sitting balance-Leahy Scale: Fair     Standing balance support: During functional activity;Bilateral upper extremity supported Standing balance-Leahy Scale: Poor Standing balance comment: reliant on  UE support., forward leaning                           ADL either performed or assessed with clinical judgement   ADL Overall ADL's : Needs assistance/impaired Eating/Feeding: Set up;Sitting Eating/Feeding Details (indicate cue type and reason): encouraged PO intake as pt had not eaten lunch Grooming: Set up;Min guard;Wash/dry face;Sitting   Upper Body Bathing: Min guard;Set up;Sitting   Lower Body Bathing: Moderate assistance;Sit to/from stand   Upper Body Dressing : Minimal assistance;Sitting   Lower Body Dressing: Maximal assistance;+2 for safety/equipment;Sit to/from stand Lower Body Dressing Details (indicate cue type and reason): pt attempting to don socks seated EOB, unable to, required assist Toilet Transfer: Minimal assistance;+2 for physical assistance;+2 for safety/equipment;Stand-pivot;RW Toilet Transfer Details (indicate cue type and reason): simulated via transfer to recliner, pt too fatigued/weak for room level ambulation Toileting- Clothing Manipulation and Hygiene: Moderate assistance;Maximal assistance;Sit to/from stand       Functional mobility during ADLs: Minimal assistance;+2 for physical assistance;+2 for  safety/equipment;Rolling walker General ADL Comments: pt weak and deconditioned, with cognitive impairments                         Pertinent Vitals/Pain Pain Assessment: Faces Faces Pain Scale: Hurts a little bit Pain Location: joints when sitting down Pain Descriptors / Indicators: Discomfort Pain Intervention(s): Limited activity within patient's tolerance;Monitored during session;Repositioned     Hand Dominance Right   Extremity/Trunk Assessment Upper Extremity Assessment Upper Extremity Assessment: Generalized weakness   Lower Extremity Assessment Lower Extremity Assessment: Defer to PT evaluation   Cervical / Trunk Assessment Cervical / Trunk Assessment: Kyphotic   Communication Communication Communication: (voice is very quiet)   Cognition Arousal/Alertness: Awake/alert Behavior During Therapy: Flat affect Overall Cognitive Status: No family/caregiver present to determine baseline cognitive functioning Area of Impairment: Attention;Memory;Following commands;Safety/judgement;Awareness;Problem solving                 Orientation Level: Disoriented to;Time;Situation;Place(max cues to state place) Current Attention Level: Selective Memory: Decreased short-term memory Following Commands: Follows one step commands inconsistently;Follows one step commands with increased time Safety/Judgement: Decreased awareness of deficits;Decreased awareness of safety Awareness: Intellectual Problem Solving: Slow processing;Decreased initiation;Difficulty sequencing;Requires verbal cues General Comments: oriented to hospital when given choice, and to . Stated he was in [place] rehab.   General Comments       Exercises     Shoulder Instructions      Home Living Family/patient expects to be discharged to:: Private residence Living Arrangements: Alone;Non-relatives/Friends   Type of Home: House Home Access: Stairs to enter Technical brewer of Steps:  2-3 Entrance Stairs-Rails: Right;Left Home Layout: One level               Home Equipment: Adel - single point   Additional Comments: patient could not provide all info      Prior Functioning/Environment          Comments: per patient, ambulated with a cane, unsure for functional level PTA        OT Problem List: Decreased strength;Decreased range of motion;Decreased activity tolerance;Impaired balance (sitting and/or standing);Decreased safety awareness;Decreased cognition;Decreased knowledge of use of DME or AE;Cardiopulmonary status limiting activity      OT Treatment/Interventions: Self-care/ADL training;Therapeutic exercise;Energy conservation;DME and/or AE instruction;Therapeutic activities;Patient/family education;Balance training;Cognitive remediation/compensation    OT Goals(Current goals can be found in the care plan section) Acute Rehab OT Goals Patient Stated Goal: agreed to getting to recliner OT Goal Formulation: With patient Time For Goal Achievement: 05/13/19 Potential to Achieve Goals: Good  OT Frequency: Min 2X/week   Barriers to D/C:            Co-evaluation PT/OT/SLP Co-Evaluation/Treatment: Yes Reason for Co-Treatment: For patient/therapist safety;To address functional/ADL transfers PT goals addressed during session: Mobility/safety with mobility OT goals addressed during session: ADL's and self-care      AM-PAC OT "6 Clicks" Daily Activity     Outcome Measure Help from another person eating meals?: A Little Help from another person taking care of personal grooming?: A Little Help from another person toileting, which includes using toliet, bedpan, or urinal?: A Lot Help from another person bathing (including washing, rinsing, drying)?: A Lot Help from another person to put on and taking off regular upper body clothing?: A Little Help from another person to put on and taking off regular lower body clothing?: A Lot 6 Click Score: 15   End  of Session Equipment Utilized During Treatment: Gait belt;Rolling walker;Oxygen Nurse Communication: Mobility status  Activity Tolerance: Patient limited by fatigue Patient left: in chair;with call bell/phone within reach;with chair alarm set  OT Visit Diagnosis: Unsteadiness on feet (R26.81);Muscle weakness (generalized) (M62.81);Other symptoms and signs involving cognitive function                Time: 1350-1424 OT Time Calculation (min): 34 min Charges:  OT General Charges $OT Visit: 1 Visit OT Evaluation $OT Eval Moderate Complexity: 1 Mod  Marcy Siren, OT [employer] Pager [phone] Office [phone]   Orlando Penner 04/29/2019, 3:36 PM

## 2019-04-29 NOTE — Plan of Care (Signed)
  Problem: Clinical Measurements: Goal: Will remain free from infection Outcome: Progressing Goal: Diagnostic test results will improve Outcome: Progressing Goal: Respiratory complications will improve Outcome: Progressing Goal: Cardiovascular complication will be avoided Outcome: Progressing   Problem: Activity: Goal: Risk for activity intolerance will decrease Outcome: Progressing   Problem: Coping: Goal: Level of anxiety will decrease Outcome: Progressing   Problem: Elimination: Goal: Will not experience complications related to bowel motility Outcome: Progressing Goal: Will not experience complications related to urinary retention Outcome: Progressing   Problem: Pain Managment: Goal: General experience of comfort will improve Outcome: Progressing   Problem: Safety: Goal: Ability to remain free from injury will improve Outcome: Progressing   Problem: Skin Integrity: Goal: Risk for impaired skin integrity will decrease Outcome: Progressing   

## 2019-04-29 NOTE — Discharge Summary (Addendum)
Physician Discharge Summary  Marcus Quinn CZY:[id number] DOB: 11/22/1926 DOA: 04/27/2019  PCP: Melony Overly, MD  Admit date: 04/27/2019 Discharge date: 04/29/2019  Admitted From: Home  Disposition:  Home   Recommendations for Outpatient Follow-up and new medication changes:  1. Follow up with Dr. Marcello Moores in 2 weeks,  2. Furosemide has been increased to 40 mg daily. 3. Continue self quarantine for 2 weeks, use a mask in public and maintain physical distancing.  4. Follow up with [facility] in 2 weeks, to evaluate gallstones.    Home Health: yes   Equipment/Devices: no    Discharge Condition: stable  CODE STATUS: full  Diet recommendation: heart healthy   Brief/Interim Summary: 85 year old male past medical history of chronic atrial fibrillation, aortic stenosis(sp TAVR),dyslipidemia, coronary artery disease.Recently diagnosed with COVID-09 February 2019,apparently he has been at a skilled nursing facility and discharged posteriorly home. At his home he lives alone, he experienced a rapid decline in his physical functional capacity associated with dyspnea and lower extremity edema. No fevers, no chills, no cough. Patient tested for positive forCOVID-19 again today and was transferred to [facility] from [facility], at the time of transfer his temperature 98.5, blood pressure 136/86, heart rate 98, respiratoryrate25, oxygen saturation 97% on room air.His lungs had no wheezing or rales,heart S1-S2 present,irregularly irregular, abdomen soft, positive lower extremity edema. Sodium 143, potassium 4.1, chloride 110, bicarb 25, BUN 20, creatinine 1.20, glucose 140, AST 47, ALT 39, alkaline phosphatase 249, total bilirubin is 1.0, troponin  0.06, procalcitonin 0.56. White cell count 12.9, hemoglobin 10.6, hematocrit 30.8, platelets 278. CT chest with bilateral pleural effusions, more on the left. Abnormal appearance of the gallbladder.Possible acute cholecystitis.  Patient was admitted to  the hospital with working diagnosis right heart failure,acute on chronic, in the setting of positive COVID-19 PCR.  Patient responded well to diuresis with furosemide, further work up with echocardiography revealed preserved LV systolic function.   US abdomen with gallstones with sludge, bladder wall thickening, but no abdominal pain or nausea, will follow as outpatient with surgery.   1.  Acute on chronic diastolic heart failure exacerbation, with acute cardiogenic pulmonary edema and bilateral pleural effusions.  Patient was admitted to the telemetry unit, he received intravenous furosemide, negative fluid balance was achieved ( -4,855 ml since admission) with significant improvement of his symptoms.  Further work-up with echocardiography showed a preserved LV systolic function, 55 to 09%, with mildly increased left ventricular hypertrophy. Follow up chest film showed improvement in pulmonary edema and pleural effusions. Patient will continue furosemide at home 40 mg daily, continue beta-blockade with metoprolol.  Patient is status post TAVR, no valve malfunction per echocardiography.  2.  COVID-19 infection.  Initial positive PCR August 2020, his D-dimer was elevated, CT chest angiography was negative for pulmonary embolism.  No signs of acute viral infection.  Would recommend continue self quarantine for 2 more weeks.   3.  Chronic atrial fibrillation.  Continue rate control with metoprolol and anticoagulation with warfarin, discharge INR is 2.8, therapeutic.  4.  Dyslipidemia.  Continue rosuvastatin.  5.  Dementia, continue ariprazole.   6.  Gallstones.  Further work-up with ultrasonography, showed gallstones with sludge and marked wall thickening up to 17 mm, positive pericholecystic fluid.  Possible chronic cholecystitis.  Patient had no abdominal pain, no nausea, no vomiting, his initial leukocytosis has improved.  Would recommend outpatient follow-up with outpatient surgical clinic in 2  weeks after completing quarantine.   Discharge Diagnoses:  Principal Problem:  Acute CHF (congestive heart failure) (HCC) Active Problems:   Pneumonia due to COVID-19 virus   Atrial fibrillation, chronic (HCC)   Aortic stenosis    Discharge Instructions   Allergies as of 04/29/2019   No Known Allergies     Medication List    STOP taking these medications   amoxicillin-clavulanate 875-125 MG tablet Commonly known as: AUGMENTIN   potassium chloride SA 20 MEQ tablet Commonly known as: KLOR-CON     TAKE these medications   ARIPiprazole 10 MG tablet Commonly known as: ABILIFY Take 10 mg by mouth at bedtime.   aspirin EC 81 MG tablet Take 81 mg by mouth daily.   feeding supplement (ENSURE ENLIVE) Liqd Take 237 mLs by mouth 2 (two) times daily between meals.   furosemide 40 MG tablet Commonly known as: Lasix Take 1 tablet (40 mg total) by mouth daily. What changed:   medication strength  how much to take   gabapentin 300 MG capsule Commonly known as: NEURONTIN Take 100 mg by mouth 3 (three) times daily.   hyoscyamine 0.375 MG 12 hr tablet Commonly known as: LEVBID Take 0.375 mg by mouth every 12 (twelve) hours as needed for cramping (diarrhea).   Melatonin 3 MG Tabs Take 3 mg by mouth at bedtime.   metoprolol succinate 50 MG 24 hr tablet Commonly known as: TOPROL-XL Take 50 mg by mouth daily.   multivitamin-iron-minerals-folic acid Tabs tablet Take 1 tablet by mouth daily.   pantoprazole 40 MG tablet Commonly known as: PROTONIX Take 40 mg by mouth daily.   Potassium Chloride ER 20 MEQ Tbcr Take 20 mEq by mouth daily.   rosuvastatin 20 MG tablet Commonly known as: CRESTOR Take 20 mg by mouth daily.   saccharomyces boulardii 250 MG capsule Commonly known as: FLORASTOR Take 250 mg by mouth 2 (two) times daily.   sennosides-docusate sodium 8.6-50 MG tablet Commonly known as: SENOKOT-S Take 2 tablets by mouth at bedtime as needed for  constipation.   traMADol 50 MG tablet Commonly known as: ULTRAM Take 50 mg by mouth daily as needed for moderate pain.   vitamin C 500 MG tablet Commonly known as: ASCORBIC ACID Take 500 mg by mouth daily.   warfarin 5 MG tablet Commonly known as: COUMADIN Take 5 mg by mouth daily at 6 PM.   zinc sulfate 220 (50 Zn) MG capsule Take 220 mg by mouth daily.       No Known Allergies  Consultations:     Procedures/Studies: Dg Chest 1 View  Result Date: 04/27/2019 CLINICAL DATA:  Shortness of breath. EXAM: CHEST  1 VIEW COMPARISON:  04/26/2019 FINDINGS: The cardiac silhouette, mediastinal and hilar contours are stable. There is tortuosity and calcification of the thoracic aorta. Stable surgical changes from triple bypass surgery and aortic valve repair. Low lung volumes with streaky atelectasis and mild vascular crowding. No definite infiltrates or effusions. The bony thorax is intact. IMPRESSION: Chronic lung changes.  No acute pulmonary findings. Electronically Signed   By: Rudie Meyer M.D.   On: 04/27/2019 17:22   US Abdomen Limited Ruq  Result Date: 04/28/2019 CLINICAL DATA:  Gallstones EXAM: ULTRASOUND ABDOMEN LIMITED RIGHT UPPER QUADRANT COMPARISON:  04/26/2019 FINDINGS: Gallbladder: Stones measuring up to 1 cm. Sludge. Marked wall thickening at 17 mm with pericholecystic fluid. Sonographer unable to evaluate for Eulah Pont secondary to physical condition. Common bile duct: Diameter: 4.2 mm Liver: No focal lesion identified. Within normal limits in parenchymal echogenicity. Poor visibility of the left lobe. Focal fluid  collection along the anterior edge of the liver measuring 4 x 3.9 by 1.3 cm portal vein is patent on color Doppler imaging with normal direction of blood flow towards the liver. Other: Incidental note made of right pleural effusion. IMPRESSION: 1. Gallstones with sludge and marked wall thickening up to 17 mm in addition to pericholecystic fluid. Sonographic Eulah PontMurphy  unable to be assessed secondary to patient condition. Findings are similar as compared with 04/26/2019 and may reflect acute or chronic cholecystitis; nuclear medicine hepatobiliary imaging could be performed to assess for acute disease. 2. Right pleural effusion 3. Focal fluid collection at the anterior edge of liver, possible small amount of loculated ascites. Electronically Signed   By: Jasmine PangKim  Fujinaga M.D.   On: 04/28/2019 18:26      Procedures:   Subjective: Patient is feeling better, no nausea or vomiting, no abdominal pain, no further dyspnea.   Discharge Exam: Vitals:   04/29/19 0400 04/29/19 0757  BP: 140/85 (!) 161/108  Pulse: (!) 108 (!) 104  Resp: 18 19  Temp: 97.7 F (36.5 C) 98.2 F (36.8 C)  SpO2: 96% 98%   Vitals:   04/29/19 0000 04/29/19 0300 04/29/19 0400 04/29/19 0757  BP: 112/62  140/85 (!) 161/108  Pulse: 90  (!) 108 (!) 104  Resp: 14 18 18 19   Temp: 98.2 F (36.8 C)  97.7 F (36.5 C) 98.2 F (36.8 C)  TempSrc: Oral  Oral Oral  SpO2: 96%  96% 98%  Weight:      Height:        General: Not in pain or dyspnea.  Neurology: Awake and alert, non focal  E ENT: mild pallor, no icterus, oral mucosa moist Cardiovascular: No JVD. S1-S2 present, rhythmic, no gallops, rubs, or murmurs. No lower extremity edema. Pulmonary:  Positive breath sounds bilaterally. Gastrointestinal. Abdomen with no organomegaly, non tender, no rebound or guarding Skin. No rashes Musculoskeletal: no joint deformities   The results of significant diagnostics from this hospitalization (including imaging, microbiology, ancillary and laboratory) are listed below for reference.     Microbiology: No results found for this or any previous visit (from the past 240 hour(s)).   Labs: BNP (last 3 results) No results for input(s): BNP in the last 8760 hours. Basic Metabolic Panel: Recent Labs  Lab 04/27/19 1600 04/28/19 0600 04/29/19 0155  NA 140 141 141  K 3.8 3.3* 3.2*  CL 108 105  104  CO2 22 24 23   GLUCOSE 128* 104* 92  BUN 17 19 19   CREATININE 1.18 1.29* 1.29*  CALCIUM 8.8* 8.6* 8.5*   Liver Function Tests: Recent Labs  Lab 04/27/19 1600 04/28/19 0600 04/29/19 0155  AST 37 42* 45*  ALT 36 35 32  ALKPHOS 198* 193* 202*  BILITOT 1.0 1.7* 1.4*  PROT 7.7 7.3 7.6  ALBUMIN 2.6* 2.7* 2.8*   No results for input(s): LIPASE, AMYLASE in the last 168 hours. No results for input(s): AMMONIA in the last 168 hours. CBC: Recent Labs  Lab 04/27/19 1600 04/29/19 0709  WBC 15.8* 12.0*  HGB 10.6* 10.8*  HCT 33.0* 33.2*  MCV 92.7 91.7  PLT 309 319   Cardiac Enzymes: No results for input(s): CKTOTAL, CKMB, CKMBINDEX, TROPONINI in the last 168 hours. BNP: Invalid input(s): POCBNP CBG: Recent Labs  Lab 04/29/19 0053  GLUCAP 120*   D-Dimer Recent Labs    04/27/19 1600 04/28/19 0600  DDIMER 3.89* 6.70*   Hgb A1c No results for input(s): HGBA1C in the last 72 hours. Lipid  Profile No results for input(s): CHOL, HDL, LDLCALC, TRIG, CHOLHDL, LDLDIRECT in the last 72 hours. Thyroid function studies No results for input(s): TSH, T4TOTAL, T3FREE, THYROIDAB in the last 72 hours.  Invalid input(s): FREET3 Anemia work up Recent Labs    04/27/19 1600 04/28/19 0600  FERRITIN 489* 480*   Urinalysis No results found for: COLORURINE, APPEARANCEUR, LABSPEC, PHURINE, GLUCOSEU, HGBUR, BILIRUBINUR, KETONESUR, PROTEINUR, UROBILINOGEN, NITRITE, LEUKOCYTESUR Sepsis Labs Invalid input(s): PROCALCITONIN,  WBC,  LACTICIDVEN Microbiology No results found for this or any previous visit (from the past 240 hour(s)).   Time coordinating discharge: 45 minutes  SIGNED:   Coralie Keens, MD  [facility] 04/29/2019, 11:53 AM

## 2019-04-30 ENCOUNTER — Encounter (HOSPITAL_COMMUNITY): Payer: Self-pay | Admitting: *Deleted

## 2019-04-30 ENCOUNTER — Other Ambulatory Visit: Payer: Self-pay

## 2019-04-30 DIAGNOSIS — N179 Acute kidney failure, unspecified: Secondary | ICD-10-CM

## 2019-04-30 DIAGNOSIS — J1289 Other viral pneumonia: Secondary | ICD-10-CM

## 2019-04-30 DIAGNOSIS — N183 Chronic kidney disease, stage 3 unspecified: Secondary | ICD-10-CM

## 2019-04-30 LAB — COMPREHENSIVE METABOLIC PANEL
ALT: 28 U/L (ref 0–44)
AST: 33 U/L (ref 15–41)
Albumin: 2.3 g/dL — ABNORMAL LOW (ref 3.5–5.0)
Alkaline Phosphatase: 171 U/L — ABNORMAL HIGH (ref 38–126)
Anion gap: 11 (ref 5–15)
BUN: 19 mg/dL (ref 8–23)
CO2: 23 mmol/L (ref 22–32)
Calcium: 8.4 mg/dL — ABNORMAL LOW (ref 8.9–10.3)
Chloride: 107 mmol/L (ref 98–111)
Creatinine, Ser: 1.24 mg/dL (ref 0.61–1.24)
GFR calc Af Amer: 59 mL/min — ABNORMAL LOW (ref >60)
GFR calc non Af Amer: 51 mL/min — ABNORMAL LOW (ref >60)
Glucose, Bld: 102 mg/dL — ABNORMAL HIGH (ref 70–99)
Potassium: 3.4 mmol/L — ABNORMAL LOW (ref 3.5–5.1)
Sodium: 141 mmol/L (ref 135–145)
Total Bilirubin: 0.8 mg/dL (ref 0.3–1.2)
Total Protein: 6.6 g/dL (ref 6.5–8.1)

## 2019-04-30 LAB — PROTIME-INR
INR: 3.2 — ABNORMAL HIGH (ref 0.8–1.2)
Prothrombin Time: 32.4 seconds — ABNORMAL HIGH (ref 11.4–15.2)

## 2019-04-30 MED ORDER — HYDRALAZINE HCL 20 MG/ML IJ SOLN
10.0000 mg | Freq: Four times a day (QID) | INTRAMUSCULAR | Status: DC | PRN
  Administered 2019-04-30: 10 mg via INTRAVENOUS
  Filled 2019-04-30 (×2): qty 1

## 2019-04-30 MED ORDER — FUROSEMIDE 20 MG PO TABS
40.0000 mg | ORAL_TABLET | Freq: Every day | ORAL | Status: DC
  Administered 2019-04-30 – 2019-05-02 (×3): 40 mg via ORAL
  Filled 2019-04-30 (×4): qty 2

## 2019-04-30 NOTE — TOC Progression Note (Signed)
Transition of Care [facility]) - Progression Note    Patient Details  Name: Marcus Quinn MRN: [id number] Date of Birth: 04-09-1927  Transition of Care [facility]) CM/SW [place], [place] Phone Number: 04/30/2019, 3:29 PM  Clinical Narrative:   CSW spoke with Shanon Brow with [facility] about patient's discharge plan. Patient is recommended for SNF, and Shanon Brow is in agreement with that. Patient had been at SNF recently at [facility], but will be unable to return there now as he is COVID+ and they are not taking COVID patients. Shanon Brow provided the names of other [facility] contracted SNFs, and CSW reached out to both [facility] and [facility] to see if they would take the patient as COVID+. Both facilities are unable to take any new COVID patients due to their outbreaks, only their current residents. CSW to reach out to [facility] in [place] to see if they would take him, sent referral and awaiting response. Patient has no bed offers at this time.         Expected Discharge Plan and Services           Expected Discharge Date: 04/29/19                                     Social Determinants of Health (SDOH) Interventions    Readmission Risk Interventions No flowsheet data found.

## 2019-04-30 NOTE — Progress Notes (Signed)
PROGRESS NOTE    Lantz Hermann  KDX:[id number] DOB: 1927-01-30 DOA: 04/27/2019 PCP: Doreene Eland, MD    Brief Narrative:  85 year old male past medical history of chronic atrial fibrillation, aortic stenosis(sp TAVR),dyslipidemia, coronary artery disease.Recently diagnosed with COVID-09 February 2019,apparently he has been at a skilled nursing facility and discharged posteriorly home. At his home he lives alone, he experienced a rapid decline in his physical functional capacity associated with dyspnea and lower extremity edema. No fevers, no chills, no cough. Patient tested for positive forCOVID-19 again today and was transferred to [facility] from [facility], at the time of transfer his temperature 98.5, blood pressure 136/86, heart rate 98, respiratoryrate25, oxygen saturation 97% on room air.His lungs had no wheezing or rales,heart S1-S2 present,irregularly irregular, abdomen soft, positive lower extremity edema. Sodium 143, potassium 4.1, chloride 110, bicarb 25, BUN 20, creatinine 1.20, glucose 140, AST 47, ALT 39, alkaline phosphatase 249, total bilirubin is 1.0, troponin 0.06, procalcitonin 0.56. White cell count 12.9, hemoglobin 10.6, hematocrit 30.8, platelets 278. CT chest with bilateral pleural effusions,more on theleft. Abnormal appearance of the gallbladder.Possible acute cholecystitis.  Patient was admitted to the hospital with working diagnosis right heart failure,acute on chronic, in the setting of positive COVID-19 PCR.  Patient responded well to diuresis with furosemide, further work up with echocardiography revealed preserved LV systolic function.   US abdomen with gallstones with sludge, bladder wall thickening, but no abdominal pain or nausea, will follow as outpatient with surgery.    Assessment & Plan:   Principal Problem:   Acute CHF (congestive heart failure) (HCC) Active Problems:   Pneumonia due to COVID-19 virus   Atrial fibrillation, chronic (HCC)  Aortic stenosis   1.  Acute on chronic diastolic heart failure exacerbation, with acute cardiogenic pulmonary edema and bilateral pleural effusions. Patient now euvilemic, will resume furosemide 40 mg po daily and continue blood pressure control.   2.  COVID-19 infection.  Initial positive PCR August 2020, no signs of active viral infection, patient is very weak and deconditioned, not safe for home discharge with high risk for complications. Social services has been consulted for SNF placement. He agrees. Improved po intake to day but continue to be very diminished.   3.  Chronic atrial fibrillation. On metoprolol for rate control and anticoagulation with warfarin, therapeutic INR.   4.  Dyslipidemia. On rosuvastatin.  5.  Dementia, On ariprazole.   6.  Gallstones.  ultrasonography, showed gallstones with sludge and marked wall thickening up to 17 mm, positive pericholecystic fluid.  Possible chronic cholecystitis.  Patient continue asymptomatic, plan to follow as outpatient.   7. CKD stage 3a. Stable renal function with serum cr at 1,24 with K at 3,4 and serum bicarbonate at 23.   DVT prophylaxis: enoxaparin    Code Status:  full Family Communication: no family at the bedside  Disposition Plan/ discharge barriers: pending SNF placement.   Body mass index is 23.06 kg/m. Malnutrition Type:      Malnutrition Characteristics:      Nutrition Interventions:     RN Pressure Injury Documentation:     Consultants:     Procedures:     Antimicrobials:       Subjective: Patient with poor appetite and decreased energy, patient to weak to go home, high fall risk. Poor oral intake yesterday. Agrees with SNF.   Objective: Vitals:   04/29/19 2145 04/30/19 0000 04/30/19 0600 04/30/19 0841  BP: (!) 163/95 132/82 (!) 168/98 (!) 183/80  Pulse:    Marland Kitchen)  116  Resp: 19   (!) 24  Temp: 98.9 F (37.2 C) 98 F (36.7 C) 98.3 F (36.8 C) 99.1 F (37.3 C)  TempSrc: Oral  Oral Oral Oral  SpO2:    96%  Weight:      Height:        Intake/Output Summary (Last 24 hours) at 04/30/2019 [id number] Last data filed at 04/30/2019 0600 Gross per 24 hour  Intake -  Output 500 ml  Net -500 ml   Filed Weights   04/28/19 1815  Weight: 77.1 kg    Examination:   General: Not in pain or dyspnea, deconditioned  Neurology: Awake and alert, non focal  E ENT: mil;d pallor, no icterus, oral mucosa moist Cardiovascular: No JVD. S1-S2 present, rhythmic, no gallops, rubs, or murmurs. No lower extremity edema. Pulmonary: positive breath sounds bilaterally. Gastrointestinal. Abdomen with no organomegaly, non tender, no rebound or guarding Skin. No rashes Musculoskeletal: no joint deformities     Data Reviewed: I have personally reviewed following labs and imaging studies  CBC: Recent Labs  Lab 04/27/19 1600 04/29/19 0709  WBC 15.8* 12.0*  HGB 10.6* 10.8*  HCT 33.0* 33.2*  MCV 92.7 91.7  PLT 309 319   Basic Metabolic Panel: Recent Labs  Lab 04/27/19 1600 04/28/19 0600 04/29/19 0155 04/30/19 0300  NA 140 141 141 141  K 3.8 3.3* 3.2* 3.4*  CL 108 105 104 107  CO2 22 24 23 23   GLUCOSE 128* 104* 92 102*  BUN 17 19 19 19   CREATININE 1.18 1.29* 1.29* 1.24  CALCIUM 8.8* 8.6* 8.5* 8.4*   GFR: Estimated Creatinine Clearance: 43.2 mL/min (by C-G formula based on SCr of 1.24 mg/dL). Liver Function Tests: Recent Labs  Lab 04/27/19 1600 04/28/19 0600 04/29/19 0155 04/30/19 0300  AST 37 42* 45* 33  ALT 36 35 32 28  ALKPHOS 198* 193* 202* 171*  BILITOT 1.0 1.7* 1.4* 0.8  PROT 7.7 7.3 7.6 6.6  ALBUMIN 2.6* 2.7* 2.8* 2.3*   No results for input(s): LIPASE, AMYLASE in the last 168 hours. No results for input(s): AMMONIA in the last 168 hours. Coagulation Profile: Recent Labs  Lab 04/27/19 1600 04/28/19 0600 04/29/19 0155 04/30/19 0300  INR 2.6* 2.6* 2.8* 3.2*   Cardiac Enzymes: No results for input(s): CKTOTAL, CKMB, CKMBINDEX, TROPONINI in the last  168 hours. BNP (last 3 results) No results for input(s): PROBNP in the last 8760 hours. HbA1C: No results for input(s): HGBA1C in the last 72 hours. CBG: Recent Labs  Lab 04/29/19 0053  GLUCAP 120*   Lipid Profile: No results for input(s): CHOL, HDL, LDLCALC, TRIG, CHOLHDL, LDLDIRECT in the last 72 hours. Thyroid Function Tests: No results for input(s): TSH, T4TOTAL, FREET4, T3FREE, THYROIDAB in the last 72 hours. Anemia Panel: Recent Labs    04/27/19 1600 04/28/19 0600  FERRITIN 489* 480*      Radiology Studies: I have reviewed all of the imaging during this hospital visit personally     Scheduled Meds: . ARIPiprazole  10 mg Oral QHS  . aspirin EC  81 mg Oral Daily  . chlorpheniramine-HYDROcodone  5 mL Oral Q12H  . feeding supplement (ENSURE ENLIVE)  237 mL Oral BID BM  . metoprolol succinate  75 mg Oral Daily  . pantoprazole  40 mg Oral Daily  . rosuvastatin  20 mg Oral q1800  . vitamin C  500 mg Oral Daily  . Warfarin - Pharmacist Dosing Inpatient   Does not apply q1800  . zinc sulfate  220 mg Oral Daily   Continuous Infusions:   LOS: 3 days        Grayce Budden Gerome Apley, MD

## 2019-04-30 NOTE — Progress Notes (Signed)
ANTICOAGULATION CONSULT NOTE - Follow Up Consult  Pharmacy Consult for Warfarin Indication: atrial fibrillation  No Known Allergies  Patient Measurements: Height: 6' (182.9 cm) Weight: 170 lb (77.1 kg) IBW/kg (Calculated) : 77.6  Vital Signs: Temp: 99.1 F (37.3 C) (11/07 0841) Temp Source: Oral (11/07 0841) BP: 183/80 (11/07 0841) Pulse Rate: 116 (11/07 0841)  Labs: Recent Labs    04/27/19 1600 04/28/19 0600 04/29/19 0155 04/29/19 0709 04/30/19 0300  HGB 10.6*  --   --  10.8*  --   HCT 33.0*  --   --  33.2*  --   PLT 309  --   --  319  --   LABPROT 27.6* 27.5* 28.7*  --  32.4*  INR 2.6* 2.6* 2.8*  --  3.2*  CREATININE 1.18 1.29* 1.29*  --  1.24    Estimated Creatinine Clearance: 43.2 mL/min (by C-G formula based on SCr of 1.24 mg/dL).   Medications:  Scheduled:  . ARIPiprazole  10 mg Oral QHS  . aspirin EC  81 mg Oral Daily  . chlorpheniramine-HYDROcodone  5 mL Oral Q12H  . feeding supplement (ENSURE ENLIVE)  237 mL Oral BID BM  . furosemide  40 mg Oral Daily  . metoprolol succinate  75 mg Oral Daily  . pantoprazole  40 mg Oral Daily  . rosuvastatin  20 mg Oral q1800  . vitamin C  500 mg Oral Daily  . Warfarin - Pharmacist Dosing Inpatient   Does not apply q1800  . zinc sulfate  220 mg Oral Daily   Infusions:    Assessment: 89 yoM admitted to [place] [facility] from [facility] with +COVID-19.  PMH is significant for chronic Afib on warfarin, dyslipidemia, CAD, andaortic stenosis s/p TAVR.  Pharmacy has been consulted to continue warfarin dosing.  PTA warfarin dose is 5mg  daily.  Today, 04/30/2019  INR 3.2  CBC 11/6: Hgb 10.6, Plt wnl.  No bleeding or complications reported.  No major drug-drug interactions identified.  D-dimer 3.89 > 6.7  Goal of Therapy:  INR 2-3 Monitor platelets by anticoagulation protocol: Yes   Plan:   Hold warfarin today  Daily PT/INR.  Monitor for signs and symptoms of bleeding.  Peggyann Juba, PharmD,  BCPS Pharmacy: [phone] 04/30/2019 1:41 PM

## 2019-05-01 DIAGNOSIS — N1831 Chronic kidney disease, stage 3a: Secondary | ICD-10-CM

## 2019-05-01 LAB — PROTIME-INR
INR: 3.7 — ABNORMAL HIGH (ref 0.8–1.2)
Prothrombin Time: 36.1 seconds — ABNORMAL HIGH (ref 11.4–15.2)

## 2019-05-01 MED ORDER — POTASSIUM CHLORIDE 20 MEQ PO PACK
20.0000 meq | PACK | Freq: Once | ORAL | Status: AC
  Administered 2019-05-01: 20 meq via ORAL
  Filled 2019-05-01: qty 1

## 2019-05-01 NOTE — Progress Notes (Addendum)
0730: Assumed care of Pt from night RN. Pt denies needs at this time  0815: Pt assessed, BP high, not within PRN parameters, other VS WNL. SpO2 95% on room air. Denies pain. Flat affect, but interactive. Helped reposition, peri care complete, reminded to call for assistance, call bell within reach.  0840: MD contacted K 3.4 yesterday and was not replaced. Suggested K replacement since getting another dose of 40mg  lasix this AM. Order received. Will continue to monitor  1200: Pt resting in bed, does not want to get up. Helped reposition. Does not want to eat. Pt seems flat and withdrawn. Gave emotional support. Will monitor  1530: Called and updated Pt's primary contact listed Remo Lipps) and he had no idea Pt was in hospital. I updated Remo Lipps on Mr Rihn stable status and continued to answer his questions. Remo Lipps expressed intense concern over Mr Payson quality of life and stated that he wanted to ensure someone discusses his code status with the patient. I informed Dr Cathlean Sauer of our conversation and also that Mr Arvelo is refusing to eat and/or participate in activity. Dr Cathlean Sauer stated that he would contact Remo Lipps.   1820: Pt resting comfortably in bed. D/C'ed from continuous monitor per order.

## 2019-05-01 NOTE — Progress Notes (Addendum)
Initial Nutrition Assessment  INTERVENTION:   -Pt receiving Hormel Shake daily with Breakfast which provides 520 kcals and 22 g of protein and Magic cup BID with lunch and dinner, each supplement provides 290 kcal and 9 grams of protein, automatically on meal trays to optimize nutritional intake.  -Ensure Enlive po BID, each supplement provides 350 kcal and 20 grams of protein  NUTRITION DIAGNOSIS:   Increased nutrient needs related to acute illness as evidenced by estimated needs.  GOAL:   Patient will meet greater than or equal to 90% of their needs  MONITOR:   PO intake, Supplement acceptance, Labs, Weight trends, I & O's  REASON FOR ASSESSMENT:   Malnutrition Screening Tool    ASSESSMENT:   85 year old male past medical history of chronic atrial fibrillation, aortic stenosis (sp TAVR), dyslipidemia, coronary artery disease.  Recently diagnosed with COVID-09 February 2019, apparently he has been at a skilled nursing facility and discharged posteriorly home.  At his home he lives alone, he experienced a rapid decline in his physical functional capacity associated with dyspnea and lower extremity edema.Patient was admitted to the hospital with working diagnosis right heart failure, acute on chronic, in the setting of positive COVID-19 PCR. 11/4: COVID-19 positive  **RD working remotely**  Patient from home, was recently in SNF and first tested positive for COVID-19 in August 2020. Pt still positive as of 11/4 but only symptom is dyspnea. Per chart review, pt with possible cholecystitis.  Pt awaiting return to SNF. Pt documented as consuming 0% of breakfast. Will order Ensure supplements given increased needs from COVID-19.  Per weight records from care everywhere, pt weighed 177 lbs on 11/1 (3% wt loss x 1 week). Pt is currently being diuresed, suspect loss is from fluid.  I/Os: -5L since admit UOP: 250 ml x 24 hrs  Medications: Lasix tablet, Vitamin C tablet, Zinc sulfate  capsule Labs reviewed: Low K  NUTRITION - FOCUSED PHYSICAL EXAM:  Working remotely.  Diet Order:   Diet Order            Diet - low sodium heart healthy        Diet Heart Room service appropriate? Yes; Fluid consistency: Thin  Diet effective now              EDUCATION NEEDS:   No education needs have been identified at this time  Skin:  Skin Assessment: Reviewed RN Assessment  Last BM:  11/5 - type 5  Height:   Ht Readings from Last 1 Encounters:  04/28/19 6' (1.829 m)    Weight:   Wt Readings from Last 1 Encounters:  04/28/19 77.1 kg    Ideal Body Weight:  80.9 kg  BMI:  Body mass index is 23.06 kg/m.  Estimated Nutritional Needs:   Kcal:  1800-2000  Protein:  85-95g  Fluid:  1.8L/day  Clayton Bibles, MS, RD, LDN Inpatient Clinical Dietitian Pager: [phone] After Hours Pager: [phone]

## 2019-05-01 NOTE — Progress Notes (Signed)
ANTICOAGULATION CONSULT NOTE - Follow Up Consult  Pharmacy Consult for Warfarin Indication: atrial fibrillation  No Known Allergies  Patient Measurements: Height: 6' (182.9 cm) Weight: 170 lb (77.1 kg) IBW/kg (Calculated) : 77.6  Vital Signs: Temp: 98.3 F (36.8 C) (11/08 0747) Temp Source: Oral (11/08 0747) BP: 128/80 (11/08 0950) Pulse Rate: 102 (11/08 0950)  Labs: Recent Labs    04/29/19 0155 04/29/19 0709 04/30/19 0300 05/01/19 0507  HGB  --  10.8*  --   --   HCT  --  33.2*  --   --   PLT  --  319  --   --   LABPROT 28.7*  --  32.4* 36.1*  INR 2.8*  --  3.2* 3.7*  CREATININE 1.29*  --  1.24  --     Estimated Creatinine Clearance: 43.2 mL/min (by C-G formula based on SCr of 1.24 mg/dL).   Medications:  Scheduled:  . ARIPiprazole  10 mg Oral QHS  . aspirin EC  81 mg Oral Daily  . chlorpheniramine-HYDROcodone  5 mL Oral Q12H  . feeding supplement (ENSURE ENLIVE)  237 mL Oral BID BM  . furosemide  40 mg Oral Daily  . metoprolol succinate  75 mg Oral Daily  . pantoprazole  40 mg Oral Daily  . rosuvastatin  20 mg Oral q1800  . vitamin C  500 mg Oral Daily  . Warfarin - Pharmacist Dosing Inpatient   Does not apply q1800  . zinc sulfate  220 mg Oral Daily   Infusions:    Assessment: 89 yoM admitted to [facility] from [facility] with +COVID-19.  PMH is significant for chronic Afib on warfarin, dyslipidemia, CAD, andaortic stenosis s/p TAVR.  Pharmacy has been consulted to continue warfarin dosing.  PTA warfarin dose is 5mg  daily.  Today, 05/01/2019  INR up to 3.7 (dose held yesterday)  CBC 11/6: Hgb 10.6, Plt wnl.  No bleeding or complications reported.  No major drug-drug interactions identified.  Goal of Therapy:  INR 2-3 Monitor platelets by anticoagulation protocol: Yes   Plan:   Hold warfarin again today  Daily PT/INR.  Monitor for signs and symptoms of bleeding.  Sherlon Handing, PharmD, BCPS CGV Clinical pharmacist phone  [phone] 05/01/2019 11:11 AM

## 2019-05-01 NOTE — Progress Notes (Signed)
PROGRESS NOTE    Marcus Quinn  CWC:[id number] DOB: [birth year] DOA: 04/27/2019 PCP: Melony Overly, MD    Brief Narrative:  85 year old male past medical history of chronic atrial fibrillation, aortic stenosis(spTAVR),dyslipidemia, coronary artery disease.Recently diagnosed with COVID-09 February 2019,apparently he has been at a skilled nursing facility and discharged posteriorly home. At his home he lives alone, he experienced a rapid decline in his physical functional capacity associated with dyspnea and lower extremity edema. No fevers,no chills, no cough. Patient tested for positive forCOVID-19 again today and was transferred to [facility], at the time of transfer his temperature 98.5, blood pressure 136/86, heart rate 98, respiratoryrate25, oxygen saturation 97% on room air.His lungs had no wheezing or rales,heart S1-S2 present,irregularly irregular, abdomen soft, positive lower extremity edema. Sodium 143, potassium 4.1, chloride 110, bicarb 25, BUN 20, creatinine 1.20, glucose 140, AST 47, ALT 39, alkaline phosphatase 249, total bilirubin is 1.0, troponin 0.06, procalcitonin 0.56. White cell count 12.9, hemoglobin 10.6, hematocrit 30.8, platelets 278. CT chest with bilateral pleural effusions,more on theleft. Abnormal appearance of the gallbladder.Possible acute cholecystitis.  Patient was admitted to the hospital with working diagnosis right heart failure,acute on chronic, in the setting of positive COVID-19 PCR.  Patient responded well to diuresis with furosemide, further work up with echocardiography revealed preserved LV systolic function.   US abdomen with gallstones with sludge, bladder wall thickening, but no abdominal pain or nausea, will follow as outpatient with surgery.     Assessment & Plan:   Principal Problem:   Acute CHF (congestive heart failure) (HCC) Active Problems:   Pneumonia due to COVID-19 virus   Atrial fibrillation, chronic (HCC)   Aortic stenosis     1.Acute on chronic diastolic heart failure exacerbation, with acute cardiogenic pulmonary edema and bilateral pleural effusions. Continue to be stable, physically very deconditioned and waiting for SNF. Tolerating well po furosemide for diuresis. Continue with metoprolol as beta blockade.   2.COVID-19 infection.Initial positive PCR August 2020,no signs of active viral infection. Likely further deconditioned as sequale from severe viral process. Plan for SNF.   3.Chronic atrial fibrillation.Continue rate control with metoprolol and anticoagulation with warfarin per pharmacy protocol, inr target 2 to 3.   4.Dyslipidemia.Continue with rosuvastatin.  5.Dementia, Continue with ariprazole.   6.Gallstones.ultrasonography,showed gallstones with sludge and marked wall thickening up to 17 mm, positive pericholecystic fluid. Possible chronic cholecystitis. Asymptomatic. Follow as outpatient.  7. CKD stage 3a with hypokalemia. Continue oral furosemide, will add 59 kcl today and will follow on renal panel in am.    DVT prophylaxis: enoxaparin    Code Status:  full Family Communication: no family at the bedside  Disposition Plan/ discharge barriers: pending SNF placement.    Body mass index is 23.06 kg/m. Malnutrition Type:      Malnutrition Characteristics:      Nutrition Interventions:     RN Pressure Injury Documentation:     Consultants:     Procedures:     Antimicrobials:       Subjective: Patient continue to be very weak and deconditioned, positive po intake, with no nausea or vomiting, no dyspnea or chest pain.   Objective: Vitals:   04/30/19 1536 04/30/19 2011 05/01/19 0500 05/01/19 0747  BP: (!) 144/97 (!) 172/88 (!) 147/90 (!) 171/89  Pulse: (!) 110  (!) 104 (!) 109  Resp: 20   16  Temp: 98.3 F (36.8 C) 98 F (36.7 C) 97.9 F (36.6 C)   TempSrc: Oral Oral Oral   SpO2: 97%  95% 96%  Weight:       Height:       No intake or output data in the 24 hours ending 05/01/19 0936 Filed Weights   04/28/19 1815  Weight: 77.1 kg    Examination:   General: Not in pain or dyspnea, deconditioned.  Neurology: Awake and alert, non focal  E ENT: mild pallor, no icterus, oral mucosa moist Cardiovascular: No JVD. S1-S2 present, rhythmic, no gallops, rubs, or murmurs. No lower extremity edema. Pulmonary: positive breath sounds bilaterally. Gastrointestinal. Abdomen with no organomegaly, non tender, no rebound or guarding Skin. No rashes Musculoskeletal: no joint deformities     Data Reviewed: I have personally reviewed following labs and imaging studies  CBC: Recent Labs  Lab 04/27/19 1600 04/29/19 0709  WBC 15.8* 12.0*  HGB 10.6* 10.8*  HCT 33.0* 33.2*  MCV 92.7 91.7  PLT 309 319   Basic Metabolic Panel: Recent Labs  Lab 04/27/19 1600 04/28/19 0600 04/29/19 0155 04/30/19 0300  NA 140 141 141 141  K 3.8 3.3* 3.2* 3.4*  CL 108 105 104 107  CO2 22 24 23 23   GLUCOSE 128* 104* 92 102*  BUN 17 19 19 19   CREATININE 1.18 1.29* 1.29* 1.24  CALCIUM 8.8* 8.6* 8.5* 8.4*   GFR: Estimated Creatinine Clearance: 43.2 mL/min (by C-G formula based on SCr of 1.24 mg/dL). Liver Function Tests: Recent Labs  Lab 04/27/19 1600 04/28/19 0600 04/29/19 0155 04/30/19 0300  AST 37 42* 45* 33  ALT 36 35 32 28  ALKPHOS 198* 193* 202* 171*  BILITOT 1.0 1.7* 1.4* 0.8  PROT 7.7 7.3 7.6 6.6  ALBUMIN 2.6* 2.7* 2.8* 2.3*   No results for input(s): LIPASE, AMYLASE in the last 168 hours. No results for input(s): AMMONIA in the last 168 hours. Coagulation Profile: Recent Labs  Lab 04/27/19 1600 04/28/19 0600 04/29/19 0155 04/30/19 0300 05/01/19 0507  INR 2.6* 2.6* 2.8* 3.2* 3.7*   Cardiac Enzymes: No results for input(s): CKTOTAL, CKMB, CKMBINDEX, TROPONINI in the last 168 hours. BNP (last 3 results) No results for input(s): PROBNP in the last 8760 hours. HbA1C: No results for  input(s): HGBA1C in the last 72 hours. CBG: Recent Labs  Lab 04/29/19 0053  GLUCAP 120*   Lipid Profile: No results for input(s): CHOL, HDL, LDLCALC, TRIG, CHOLHDL, LDLDIRECT in the last 72 hours. Thyroid Function Tests: No results for input(s): TSH, T4TOTAL, FREET4, T3FREE, THYROIDAB in the last 72 hours. Anemia Panel: No results for input(s): VITAMINB12, FOLATE, FERRITIN, TIBC, IRON, RETICCTPCT in the last 72 hours.    Radiology Studies: I have reviewed all of the imaging during this hospital visit personally     Scheduled Meds: . ARIPiprazole  10 mg Oral QHS  . aspirin EC  81 mg Oral Daily  . chlorpheniramine-HYDROcodone  5 mL Oral Q12H  . feeding supplement (ENSURE ENLIVE)  237 mL Oral BID BM  . furosemide  40 mg Oral Daily  . metoprolol succinate  75 mg Oral Daily  . pantoprazole  40 mg Oral Daily  . potassium chloride  20 mEq Oral Once  . rosuvastatin  20 mg Oral q1800  . vitamin C  500 mg Oral Daily  . Warfarin - Pharmacist Dosing Inpatient   Does not apply q1800  . zinc sulfate  220 mg Oral Daily   Continuous Infusions:   LOS: 4 days        Marcus Quinn 13/08/20, MD

## 2019-05-02 LAB — BASIC METABOLIC PANEL
Anion gap: 15 (ref 5–15)
BUN: 18 mg/dL (ref 8–23)
CO2: 22 mmol/L (ref 22–32)
Calcium: 8.8 mg/dL — ABNORMAL LOW (ref 8.9–10.3)
Chloride: 105 mmol/L (ref 98–111)
Creatinine, Ser: 1.32 mg/dL — ABNORMAL HIGH (ref 0.61–1.24)
GFR calc Af Amer: 55 mL/min — ABNORMAL LOW (ref >60)
GFR calc non Af Amer: 47 mL/min — ABNORMAL LOW (ref >60)
Glucose, Bld: 85 mg/dL (ref 70–99)
Potassium: 3.5 mmol/L (ref 3.5–5.1)
Sodium: 142 mmol/L (ref 135–145)

## 2019-05-02 LAB — PROTIME-INR
INR: 3.8 — ABNORMAL HIGH (ref 0.8–1.2)
Prothrombin Time: 36.6 seconds — ABNORMAL HIGH (ref 11.4–15.2)

## 2019-05-02 NOTE — Plan of Care (Signed)

## 2019-05-02 NOTE — Progress Notes (Signed)
PROGRESS NOTE    Marcus Quinn  IWL:[id number] DOB: Dec 27, 1926 DOA: 04/27/2019 PCP: Doreene Eland, MD    Brief Narrative:  85 year old male past medical history of chronic atrial fibrillation, aortic stenosis(spTAVR),dyslipidemia, coronary artery disease.Recently diagnosed with COVID-09 February 2019,apparently he has been at a skilled nursing facility and discharged posteriorly home. At his home he lives alone, he experienced a rapid decline in his physical functional capacity associated with dyspnea and lower extremity edema. No fevers,no chills, no cough. Patient tested for positive forCOVID-19 again today and was transferred to [facility], at the time of transfer his temperature 98.5, blood pressure 136/86, heart rate 98, respiratoryrate25, oxygen saturation 97% on room air.His lungs had no wheezing or rales,heart S1-S2 present,irregularly irregular, abdomen soft, positive lower extremity edema. Sodium 143, potassium 4.1, chloride 110, bicarb 25, BUN 20, creatinine 1.20, glucose 140, AST 47, ALT 39, alkaline phosphatase 249, total bilirubin is 1.0, troponin 0.06, procalcitonin 0.56. White cell count 12.9, hemoglobin 10.6, hematocrit 30.8, platelets 278. CT chest with bilateral pleural effusions,more on theleft. Abnormal appearance of the gallbladder.Possible acute cholecystitis.  Patient was admitted to the hospital with working diagnosis of right heart failure,acute on chronic, in the setting of positive COVID-19 PCR.  Patient responded well to diuresis with furosemide, further work up with echocardiography revealed preserved LV systolic function.   US abdomen with gallstones with sludge, bladder wall thickening, but no abdominal pain or nausea, will follow as outpatient with surgery.  Patient with very poor oral intake, refusing to eat. Still taking medications po. Very weak and deconditioned, recommendations to transfer to SNF to continue physical therapy.     Assessment & Plan:   Principal Problem:   Acute CHF (congestive heart failure) (HCC) Active Problems:   Pneumonia due to COVID-19 virus   Atrial fibrillation, chronic (HCC)   Aortic stenosis    1.Acute on chronic diastolic heart failure exacerbation, with acute cardiogenic pulmonary edema and bilateral pleural effusions.No further dyspnea and clinically euvolemic. Decrease po intake and mild increase in serum cr, will hold on furosemide for now.   2.COVID-19 infection.Initial positive PCR August 2020,no signs of active viral infection. Patient with sequale from severe viral process.   3.Chronic atrial fibrillation.On rate control with metoprololand  warfarin per pharmacy protocol, per pharmacy protocol. inr target 2 to 3.   4.Dyslipidemia.On rosuvastatin.  5.Dementia,On ariprazole.   6.Gallstones.ultrasonography,showed gallstones with sludge and marked wall thickening up to 17 mm, positive pericholecystic fluid. Possible chronic cholecystitis.Poor oral intake but no abdominal pain, no nausea or vomiting, will continue plan for follow up as outpatient.   7. CKD stage 3a with hypokalemia. Renal function with serum cr at 1,32 from 1, 24, K at 3,5 and serum bicarbonate at 22. Will hold on furosemide for now due to poor oral intake.    DVT prophylaxis:enoxaparin Code Status:full ( I discuss with him code status, he will think about it, no decision yet)  Family Communication:no family at the bedside Disposition Plan/ discharge barriers:pending SNF placement    Body mass index is 23.06 kg/m. Malnutrition Type:  Nutrition Problem: Increased nutrient needs Etiology: acute illness   Malnutrition Characteristics:  Signs/Symptoms: estimated needs   Nutrition Interventions:  Interventions: Ensure Enlive (each supplement provides 350kcal and 20 grams of protein), MVI  RN Pressure Injury Documentation:     Consultants:      Procedures:     Antimicrobials:       Subjective: Patient with no dyspnea or chest pain, continue to be very weak and  deconditioned, has been refusing to eat.   Objective: Vitals:   05/01/19 1632 05/01/19 2130 05/02/19 0500 05/02/19 0800  BP: (!) 162/100 (!) 165/94 135/80 (!) 129/91  Pulse: 98 98 97 (!) 101  Resp: 20   20  Temp: 98.3 F (36.8 C) 98 F (36.7 C) 98 F (36.7 C) 98.1 F (36.7 C)  TempSrc: Oral Oral Oral Oral  SpO2: 95%  96% 95%  Weight:      Height:        Intake/Output Summary (Last 24 hours) at 05/02/2019 0836 Last data filed at 05/02/2019 0109 Gross per 24 hour  Intake 598 ml  Output 500 ml  Net 98 ml   Filed Weights   04/28/19 1815  Weight: 77.1 kg    Examination:   General: Not in pain or dyspnea, deconditioned  Neurology: Awake and alert, non focal  E ENT: positive pallor, no icterus, oral mucosa moist Cardiovascular: No JVD. S1-S2 present, rhythmic, no gallops, rubs, or murmurs. No lower extremity edema. Pulmonary: positive breath sounds bilaterally. Gastrointestinal. Abdomen with no organomegaly, non tender, no rebound or guarding Skin. No rashes Musculoskeletal: no joint deformities     Data Reviewed: I have personally reviewed following labs and imaging studies  CBC: Recent Labs  Lab 04/27/19 1600 04/29/19 0709  WBC 15.8* 12.0*  HGB 10.6* 10.8*  HCT 33.0* 33.2*  MCV 92.7 91.7  PLT 309 319   Basic Metabolic Panel: Recent Labs  Lab 04/27/19 1600 04/28/19 0600 04/29/19 0155 04/30/19 0300 05/02/19 0430  NA 140 141 141 141 142  K 3.8 3.3* 3.2* 3.4* 3.5  CL 108 105 104 107 105  CO2 22 24 23 23 22   GLUCOSE 128* 104* 92 102* 85  BUN 17 19 19 19 18   CREATININE 1.18 1.29* 1.29* 1.24 1.32*  CALCIUM 8.8* 8.6* 8.5* 8.4* 8.8*   GFR: Estimated Creatinine Clearance: 40.6 mL/min (A) (by C-G formula based on SCr of 1.32 mg/dL (H)). Liver Function Tests: Recent Labs  Lab 04/27/19 1600 04/28/19 0600 04/29/19 0155 04/30/19  0300  AST 37 42* 45* 33  ALT 36 35 32 28  ALKPHOS 198* 193* 202* 171*  BILITOT 1.0 1.7* 1.4* 0.8  PROT 7.7 7.3 7.6 6.6  ALBUMIN 2.6* 2.7* 2.8* 2.3*   No results for input(s): LIPASE, AMYLASE in the last 168 hours. No results for input(s): AMMONIA in the last 168 hours. Coagulation Profile: Recent Labs  Lab 04/28/19 0600 04/29/19 0155 04/30/19 0300 05/01/19 0507 05/02/19 0430  INR 2.6* 2.8* 3.2* 3.7* 3.8*   Cardiac Enzymes: No results for input(s): CKTOTAL, CKMB, CKMBINDEX, TROPONINI in the last 168 hours. BNP (last 3 results) No results for input(s): PROBNP in the last 8760 hours. HbA1C: No results for input(s): HGBA1C in the last 72 hours. CBG: Recent Labs  Lab 04/29/19 0053  GLUCAP 120*   Lipid Profile: No results for input(s): CHOL, HDL, LDLCALC, TRIG, CHOLHDL, LDLDIRECT in the last 72 hours. Thyroid Function Tests: No results for input(s): TSH, T4TOTAL, FREET4, T3FREE, THYROIDAB in the last 72 hours. Anemia Panel: No results for input(s): VITAMINB12, FOLATE, FERRITIN, TIBC, IRON, RETICCTPCT in the last 72 hours.    Radiology Studies: I have reviewed all of the imaging during this hospital visit personally     Scheduled Meds: . ARIPiprazole  10 mg Oral QHS  . aspirin EC  81 mg Oral Daily  . chlorpheniramine-HYDROcodone  5 mL Oral Q12H  . feeding supplement (ENSURE ENLIVE)  237 mL Oral BID BM  .  metoprolol succinate  75 mg Oral Daily  . pantoprazole  40 mg Oral Daily  . rosuvastatin  20 mg Oral q1800  . vitamin C  500 mg Oral Daily  . Warfarin - Pharmacist Dosing Inpatient   Does not apply q1800  . zinc sulfate  220 mg Oral Daily   Continuous Infusions:   LOS: 5 days        Lemma Tetro Gerome Apley, MD

## 2019-05-02 NOTE — Progress Notes (Signed)
ANTICOAGULATION CONSULT NOTE - Follow Up Consult  Pharmacy Consult for Warfarin Indication: atrial fibrillation  No Known Allergies  Patient Measurements: Height: 6' (182.9 cm) Weight: 170 lb (77.1 kg) IBW/kg (Calculated) : 77.6  Vital Signs: Temp: 98.1 F (36.7 C) (11/09 0800) Temp Source: Oral (11/09 0800) BP: 129/91 (11/09 0800) Pulse Rate: 101 (11/09 0800)  Labs: Recent Labs    04/30/19 0300 05/01/19 0507 05/02/19 0430  LABPROT 32.4* 36.1* 36.6*  INR 3.2* 3.7* 3.8*  CREATININE 1.24  --  1.32*    Estimated Creatinine Clearance: 40.6 mL/min (A) (by C-G formula based on SCr of 1.32 mg/dL (H)).   Medications:  Scheduled:  . ARIPiprazole  10 mg Oral QHS  . aspirin EC  81 mg Oral Daily  . chlorpheniramine-HYDROcodone  5 mL Oral Q12H  . feeding supplement (ENSURE ENLIVE)  237 mL Oral BID BM  . metoprolol succinate  75 mg Oral Daily  . pantoprazole  40 mg Oral Daily  . rosuvastatin  20 mg Oral q1800  . vitamin C  500 mg Oral Daily  . Warfarin - Pharmacist Dosing Inpatient   Does not apply q1800  . zinc sulfate  220 mg Oral Daily   Infusions:    Assessment: 89 yoM admitted to [place] from [facility] with +COVID-19.  PMH is significant for chronic Afib on warfarin, dyslipidemia, CAD, andaortic stenosis s/p TAVR.  Pharmacy has been consulted to continue warfarin dosing.  PTA warfarin dose is 5mg  daily.  Today, 05/02/2019  INR relatively unchanged at 3.8 (dose held x 2 days in a row)  No bleeding or complications reported.  No major drug-drug interactions identified.  Goal of Therapy:  INR 2-3 Monitor platelets by anticoagulation protocol: Yes   Plan:   Hold warfarin again today  Daily PT/INR.  Monitor for signs and symptoms of bleeding.  Albertina Parr, PharmD., BCPS Clinical Pharmacist Clinical phone for 05/02/19 until 5pm: [phone]

## 2019-05-02 NOTE — Progress Notes (Signed)
Attempted to call the patient's point of contact Remo Lipps. There was no answer at this time.

## 2019-05-02 NOTE — Progress Notes (Signed)
Patient refusing to eat.

## 2019-05-03 LAB — PROTIME-INR
INR: 3.9 — ABNORMAL HIGH (ref 0.8–1.2)
Prothrombin Time: 37.5 seconds — ABNORMAL HIGH (ref 11.4–15.2)

## 2019-05-03 NOTE — Plan of Care (Signed)

## 2019-05-03 NOTE — Progress Notes (Signed)
PROGRESS NOTE    Marcus Quinn  IWP:[id number] DOB: 1927/05/08 DOA: 04/27/2019 PCP: Doreene Eland, MD    Brief Narrative:  85 year old male past medical history of chronic atrial fibrillation, aortic stenosis(spTAVR),dyslipidemia, coronary artery disease.Recently diagnosed with COVID-09 February 2019,apparently he has been at a skilled nursing facility and discharged posteriorly home. At his home he lives alone, he experienced a rapid decline in his physical functional capacity associated with dyspnea and lower extremity edema. No fevers,no chills, no cough. Patient tested for positive forCOVID-19 again today and was transferred to [facility], at the time of transfer his temperature 98.5, blood pressure 136/86, heart rate 98, respiratoryrate25, oxygen saturation 97% on room air.His lungs had no wheezing or rales,heart S1-S2 present,irregularly irregular, abdomen soft, positive lower extremity edema. Sodium 143, potassium 4.1, chloride 110, bicarb 25, BUN 20, creatinine 1.20, glucose 140, AST 47, ALT 39, alkaline phosphatase 249, total bilirubin is 1.0, troponin 0.06, procalcitonin 0.56. White cell count 12.9, hemoglobin 10.6, hematocrit 30.8, platelets 278. CT chest with bilateral pleural effusions,more on theleft. Abnormal appearance of the gallbladder.Possible acute cholecystitis.  Patient was admitted to the hospital with working diagnosis of right heart failure,acute on chronic, in the setting of positive COVID-19 PCR.  Patient responded well to diuresis with furosemide, further work up with echocardiography revealed preserved LV systolic function.   US abdomen with gallstones with sludge, bladder wall thickening, but no abdominal pain or nausea, will follow as outpatient with surgery.  Patient with very poor oral intake, refusing to eat. Still taking medications po. Very weak and deconditioned, recommendations to transfer to SNF to continue physical therapy.     Assessment & Plan:   Principal Problem:   Acute CHF (congestive heart failure) (HCC) Active Problems:   Pneumonia due to COVID-19 virus   Atrial fibrillation, chronic (HCC)   Aortic stenosis    1.Acute on chronic diastolic heart failure exacerbation, with acute cardiogenic pulmonary edema and bilateral pleural effusions.Clinically euvolemic after diuresis with IV furosemide, now his po intake has reduced and furosemide on hold. Continue blood pressure monitoring. Patient is very weak and deconditioned, lives alone, and high risk for medical complications. Pending placement at SNF.   2.COVID-19 infection.Initial positive PCR August 2020,no signs of active viral infection. Likely patient is experiencing sequale from severe viral process, with significant decline in his physical functional capacity.    3.Chronic atrial fibrillation.Continue rate control with metoprolol, warfarin per pharmacy protocol, today's INR is 3,9.   4.Dyslipidemia.Continue with rosuvastatin.  5.Dementia,Today confused and willing to go home, I explained him high risk for medical complication if goes home alone. Will continue with  ariprazole.   6.Gallstones.ultrasonography,showed gallstones with sludge and marked wall thickening up to 17 mm, positive pericholecystic fluid. Possible chronic cholecystitis.Patient continue asymptomatic, plan for follow up as outpatient.    7. CKD stage 3awith hypokalemia. Serum cr 1,32 yesterday, patient with poor oral intake, will check renal panel in am, continue to encourage po intake.   DVT prophylaxis:enoxaparin Code Status:full ( I discuss with him code status, he will think about it, no decision yet)  Family Communication:no family at the bedside Disposition Plan/ discharge barriers:pending SNF placement    Body mass index is 23.06 kg/m. Malnutrition Type:  Nutrition Problem: Increased nutrient needs Etiology: acute illness    Malnutrition Characteristics:  Signs/Symptoms: estimated needs   Nutrition Interventions:  Interventions: Ensure Enlive (each supplement provides 350kcal and 20 grams of protein), MVI  RN Pressure Injury Documentation:     Consultants:  Procedures:     Antimicrobials:       Subjective: Patient is willing to go home but explained that he is very weak and risk of worsening condition at home alone. His dyspnea has improved, no nausea or vomiting, no chest pain.   Objective: Vitals:   05/02/19 1737 05/02/19 1948 05/03/19 0504 05/03/19 0814  BP: (!) 146/99 129/85 134/86 130/84  Pulse: 95 98 (!) 107 (!) 105  Resp: 18     Temp: 98 F (36.7 C) 98.3 F (36.8 C) 98.7 F (37.1 C)   TempSrc: Oral Oral Oral   SpO2: 99% 97% 96% 97%  Weight:      Height:        Intake/Output Summary (Last 24 hours) at 05/03/2019 0836 Last data filed at 05/03/2019 0505 Gross per 24 hour  Intake 290 ml  Output 200 ml  Net 90 ml   Filed Weights   04/28/19 1815  Weight: 77.1 kg    Examination:   General: Not in pain or dyspnea, deconditioned  Neurology: Awake and alert, non focal  E ENT: mild pallor, no icterus, oral mucosa moist Cardiovascular: No JVD. S1-S2 present, rhythmic, no gallops, rubs, or murmurs. No lower extremity edema. Pulmonary: positive breath sounds bilaterally.  Gastrointestinal. Abdomen with  no organomegaly, non tender, no rebound or guarding Skin. No rashes Musculoskeletal: no joint deformities     Data Reviewed: I have personally reviewed following labs and imaging studies  CBC: Recent Labs  Lab 04/27/19 1600 04/29/19 0709  WBC 15.8* 12.0*  HGB 10.6* 10.8*  HCT 33.0* 33.2*  MCV 92.7 91.7  PLT 309 319   Basic Metabolic Panel: Recent Labs  Lab 04/27/19 1600 04/28/19 0600 04/29/19 0155 04/30/19 0300 05/02/19 0430  NA 140 141 141 141 142  K 3.8 3.3* 3.2* 3.4* 3.5  CL 108 105 104 107 105  CO2 22 24 23 23 22   GLUCOSE 128* 104* 92 102* 85   BUN 17 19 19 19 18   CREATININE 1.18 1.29* 1.29* 1.24 1.32*  CALCIUM 8.8* 8.6* 8.5* 8.4* 8.8*   GFR: Estimated Creatinine Clearance: 40.6 mL/min (A) (by C-G formula based on SCr of 1.32 mg/dL (H)). Liver Function Tests: Recent Labs  Lab 04/27/19 1600 04/28/19 0600 04/29/19 0155 04/30/19 0300  AST 37 42* 45* 33  ALT 36 35 32 28  ALKPHOS 198* 193* 202* 171*  BILITOT 1.0 1.7* 1.4* 0.8  PROT 7.7 7.3 7.6 6.6  ALBUMIN 2.6* 2.7* 2.8* 2.3*   No results for input(s): LIPASE, AMYLASE in the last 168 hours. No results for input(s): AMMONIA in the last 168 hours. Coagulation Profile: Recent Labs  Lab 04/29/19 0155 04/30/19 0300 05/01/19 0507 05/02/19 0430 05/03/19 0205  INR 2.8* 3.2* 3.7* 3.8* 3.9*   Cardiac Enzymes: No results for input(s): CKTOTAL, CKMB, CKMBINDEX, TROPONINI in the last 168 hours. BNP (last 3 results) No results for input(s): PROBNP in the last 8760 hours. HbA1C: No results for input(s): HGBA1C in the last 72 hours. CBG: Recent Labs  Lab 04/29/19 0053  GLUCAP 120*   Lipid Profile: No results for input(s): CHOL, HDL, LDLCALC, TRIG, CHOLHDL, LDLDIRECT in the last 72 hours. Thyroid Function Tests: No results for input(s): TSH, T4TOTAL, FREET4, T3FREE, THYROIDAB in the last 72 hours. Anemia Panel: No results for input(s): VITAMINB12, FOLATE, FERRITIN, TIBC, IRON, RETICCTPCT in the last 72 hours.    Radiology Studies: I have reviewed all of the imaging during this hospital visit personally     Scheduled Meds: .  ARIPiprazole  10 mg Oral QHS  . aspirin EC  81 mg Oral Daily  . chlorpheniramine-HYDROcodone  5 mL Oral Q12H  . feeding supplement (ENSURE ENLIVE)  237 mL Oral BID BM  . metoprolol succinate  75 mg Oral Daily  . pantoprazole  40 mg Oral Daily  . rosuvastatin  20 mg Oral q1800  . vitamin C  500 mg Oral Daily  . Warfarin - Pharmacist Dosing Inpatient   Does not apply q1800  . zinc sulfate  220 mg Oral Daily   Continuous Infusions:    LOS: 6 days        Marcus Rossell Gerome Apley, MD

## 2019-05-03 NOTE — Plan of Care (Signed)
Pt slept on and off during the night. No complaints of pain verbalized. Alert and oriented. Vitals stable on RA. No respiratory symptoms noted. Maximum assist with ADLs. Incontinent of urine, condom catheter replaced.  B heel and sacral foam dressing dry and intact. IV SL. Assisted regular repositioning for pressure relief. Encouraged po intake overnight, minimal sips of juice and ensure noted. No other issues, will monitor.   Problem: Education: Goal: Knowledge of General Education information will improve Description: Including pain rating scale, medication(s)/side effects and non-pharmacologic comfort measures Outcome: Progressing   Problem: Health Behavior/Discharge Planning: Goal: Ability to manage health-related needs will improve Outcome: Progressing   Problem: Activity: Goal: Risk for activity intolerance will decrease Outcome: Progressing   Problem: Nutrition: Goal: Adequate nutrition will be maintained Outcome: Progressing   Problem: Coping: Goal: Level of anxiety will decrease Outcome: Progressing

## 2019-05-03 NOTE — Progress Notes (Signed)
ANTICOAGULATION CONSULT NOTE - Follow Up Consult  Pharmacy Consult for Warfarin Indication: atrial fibrillation  No Known Allergies  Patient Measurements: Height: 6' (182.9 cm) Weight: 170 lb (77.1 kg) IBW/kg (Calculated) : 77.6  Vital Signs: Temp: 98.7 F (37.1 C) (11/10 0504) Temp Source: Oral (11/10 0504) BP: 130/84 (11/10 0814) Pulse Rate: 105 (11/10 0814)  Labs: Recent Labs    05/01/19 0507 05/02/19 0430 05/03/19 0205  LABPROT 36.1* 36.6* 37.5*  INR 3.7* 3.8* 3.9*  CREATININE  --  1.32*  --     Estimated Creatinine Clearance: 40.6 mL/min (A) (by C-G formula based on SCr of 1.32 mg/dL (H)).   Medications:  Scheduled:  . ARIPiprazole  10 mg Oral QHS  . aspirin EC  81 mg Oral Daily  . chlorpheniramine-HYDROcodone  5 mL Oral Q12H  . feeding supplement (ENSURE ENLIVE)  237 mL Oral BID BM  . metoprolol succinate  75 mg Oral Daily  . pantoprazole  40 mg Oral Daily  . rosuvastatin  20 mg Oral q1800  . vitamin C  500 mg Oral Daily  . Warfarin - Pharmacist Dosing Inpatient   Does not apply q1800  . zinc sulfate  220 mg Oral Daily   Infusions:    Assessment: 89 yoM admitted to [facility] from [facility] with +COVID-19.  PMH is significant for chronic Afib on warfarin, dyslipidemia, CAD, andaortic stenosis s/p TAVR.  Pharmacy has been consulted to continue warfarin dosing.  PTA warfarin dose is 5mg  daily.  Today, 05/03/2019  INR has trended up slowly to 3.9 (dose held x 3 days in a row)  No bleeding or complications reported.  No major drug-drug interactions identified.  Patient has minimal oral intake per charting   Goal of Therapy:  INR 2-3 Monitor platelets by anticoagulation protocol: Yes   Plan:   Hold warfarin again today  Daily PT/INR.  Monitor for signs and symptoms of bleeding.  Albertina Parr, PharmD., BCPS Clinical Pharmacist Clinical phone for 05/03/19 until 5pm: [phone]

## 2019-05-03 NOTE — Plan of Care (Signed)
Patient Summary: No SOB or acute distress noted. No c/o pain or coughing reported at this time. Patient refused night dose of tussinex for cough. Patient states, "I'm not coughing". Patient withdrawn and noncompliant with medications and refusing meals and repositioning in bed. Patient remains on RA sat 93%. No other adverse events noted at this time. Attempted to update point of care contact Remo Lipps with no answer, straight to voicemail. Will continue to monitor patient.  Problem: Education: Goal: Knowledge of General Education information will improve Description: Including pain rating scale, medication(s)/side effects and non-pharmacologic comfort measures Outcome: Progressing   Problem: Health Behavior/Discharge Planning: Goal: Ability to manage health-related needs will improve Outcome: Progressing   Problem: Clinical Measurements: Goal: Will remain free from infection Outcome: Progressing Goal: Diagnostic test results will improve Outcome: Progressing Goal: Respiratory complications will improve Outcome: Progressing Goal: Cardiovascular complication will be avoided Outcome: Progressing   Problem: Activity: Goal: Risk for activity intolerance will decrease Outcome: Progressing   Problem: Nutrition: Goal: Adequate nutrition will be maintained Outcome: Progressing   Problem: Coping: Goal: Level of anxiety will decrease Outcome: Progressing   Problem: Elimination: Goal: Will not experience complications related to bowel motility Outcome: Progressing Goal: Will not experience complications related to urinary retention Outcome: Progressing   Problem: Pain Managment: Goal: General experience of comfort will improve Outcome: Progressing   Problem: Safety: Goal: Ability to remain free from injury will improve Outcome: Progressing   Problem: Skin Integrity: Goal: Risk for impaired skin integrity will decrease Outcome: Progressing

## 2019-05-04 DIAGNOSIS — I50811 Acute right heart failure: Secondary | ICD-10-CM

## 2019-05-04 DIAGNOSIS — R630 Anorexia: Secondary | ICD-10-CM

## 2019-05-04 DIAGNOSIS — K8020 Calculus of gallbladder without cholecystitis without obstruction: Secondary | ICD-10-CM

## 2019-05-04 LAB — BASIC METABOLIC PANEL
Anion gap: 15 (ref 5–15)
BUN: 25 mg/dL — ABNORMAL HIGH (ref 8–23)
CO2: 26 mmol/L (ref 22–32)
Calcium: 9.1 mg/dL (ref 8.9–10.3)
Chloride: 101 mmol/L (ref 98–111)
Creatinine, Ser: 1.39 mg/dL — ABNORMAL HIGH (ref 0.61–1.24)
GFR calc Af Amer: 51 mL/min — ABNORMAL LOW (ref >60)
GFR calc non Af Amer: 44 mL/min — ABNORMAL LOW (ref >60)
Glucose, Bld: 78 mg/dL (ref 70–99)
Potassium: 3.2 mmol/L — ABNORMAL LOW (ref 3.5–5.1)
Sodium: 142 mmol/L (ref 135–145)

## 2019-05-04 LAB — PROTIME-INR
INR: 3.3 — ABNORMAL HIGH (ref 0.8–1.2)
Prothrombin Time: 33.2 seconds — ABNORMAL HIGH (ref 11.4–15.2)

## 2019-05-04 MED ORDER — MEGESTROL ACETATE 400 MG/10ML PO SUSP
400.0000 mg | Freq: Every day | ORAL | Status: DC
  Administered 2019-05-04 – 2019-05-10 (×4): 400 mg via ORAL
  Filled 2019-05-04 (×11): qty 10

## 2019-05-04 MED ORDER — POTASSIUM CHLORIDE CRYS ER 20 MEQ PO TBCR
40.0000 meq | EXTENDED_RELEASE_TABLET | Freq: Once | ORAL | Status: DC
  Filled 2019-05-04: qty 2

## 2019-05-04 MED ORDER — POTASSIUM CHLORIDE 20 MEQ/15ML (10%) PO SOLN
40.0000 meq | Freq: Once | ORAL | Status: AC
  Administered 2019-05-04: 40 meq via ORAL
  Filled 2019-05-04: qty 30

## 2019-05-04 NOTE — Progress Notes (Signed)
ANTICOAGULATION CONSULT NOTE - Follow Up Consult  Pharmacy Consult for Warfarin Indication: atrial fibrillation  No Known Allergies  Patient Measurements: Height: 6' (182.9 cm) Weight: 170 lb (77.1 kg) IBW/kg (Calculated) : 77.6  Vital Signs: Temp: 98.4 F (36.9 C) (11/11 0728) Temp Source: Oral (11/11 0728) BP: 119/67 (11/11 0728) Pulse Rate: 98 (11/11 0728)  Labs: Recent Labs    05/02/19 0430 05/03/19 0205 05/04/19 0225  LABPROT 36.6* 37.5* 33.2*  INR 3.8* 3.9* 3.3*  CREATININE 1.32*  --  1.39*    Estimated Creatinine Clearance: 38.5 mL/min (A) (by C-G formula based on SCr of 1.39 mg/dL (H)).   Medications:  Scheduled:  . ARIPiprazole  10 mg Oral QHS  . aspirin EC  81 mg Oral Daily  . chlorpheniramine-HYDROcodone  5 mL Oral Q12H  . feeding supplement (ENSURE ENLIVE)  237 mL Oral BID BM  . metoprolol succinate  75 mg Oral Daily  . pantoprazole  40 mg Oral Daily  . potassium chloride  40 mEq Oral Once  . rosuvastatin  20 mg Oral q1800  . vitamin C  500 mg Oral Daily  . Warfarin - Pharmacist Dosing Inpatient   Does not apply q1800  . zinc sulfate  220 mg Oral Daily   Infusions:    Assessment: 89 yoM admitted to [facility] from [facility] with +COVID-19.  PMH is significant for chronic Afib on warfarin, dyslipidemia, CAD, andaortic stenosis s/p TAVR.  Pharmacy has been consulted to continue warfarin dosing.  PTA warfarin dose is 5mg  daily.  Today, 05/04/2019  INR has trended down 3.3 (dose held x 4 days in a row)  No bleeding or complications reported.  No major drug-drug interactions identified.  Patient has minimal oral intake per charting   Goal of Therapy:  INR 2-3 Monitor platelets by anticoagulation protocol: Yes   Plan:   Hold warfarin again today. Anticipate resuming doses tomorrow   Daily PT/INR.  Monitor for signs and symptoms of bleeding.  Albertina Parr, PharmD., BCPS Clinical Pharmacist Clinical phone for 05/04/19 until  5pm: [phone]

## 2019-05-04 NOTE — Plan of Care (Signed)

## 2019-05-04 NOTE — Progress Notes (Signed)
Patient care giver Rolanda Jay was updated via phone. Addressed all questions and concerns. Plan of care continued. Will update receiving RN.

## 2019-05-04 NOTE — Progress Notes (Signed)
PROGRESS NOTE  Jad Johansson YTK:[id number] DOB: 1926/07/08 DOA: 04/27/2019  PCP: Doreene Eland, MD  Brief History/Interval Summary: 85 year old male past medical history of chronic atrial fibrillation, aortic stenosis(spTAVR),dyslipidemia, coronary artery disease.Recently diagnosed with COVID-09 February 2019,apparently he has been at a skilled nursing facility and then discharged home. At his home he lives alone, he experienced a rapid decline in his physical functional capacity associated with dyspnea and lower extremity edema. CT chest with bilateral pleural effusions,more on theleft. Abnormal appearance of the gallbladder.Possible acute cholecystitis. Patient was admitted to the hospital with working diagnosisofright heart failure,acute on chronic, in the setting of positive COVID-19 PCR.  Reason for Visit: Acute on chronic diastolic CHF.  Consultants: None  Procedures: None  Antibiotics: Anti-infectives (From admission, onward)   None      Subjective/Interval History: Per nursing staff patient's oral intake has been poor.  He states that he has poor appetite.  He however wants to try ice cream.  Feeding tube was discussed with him and he adamantly declines any feeding tube.  CODE STATUS was also discussed and he does not want to be resuscitated or placed on life support.    Assessment/Plan:  Acute on chronic diastolic CHF Patient had bilateral pleural effusion on CT scan.  He was diuresed with Lasix.  He improved.  Due to poor oral intake his Lasix has been stopped.  Continue to monitor volume status.  Daily weights.  Ins and outs.  Patient noted to be on beta-blocker.  COVID-19 infection Initially positive in August.  Also noted to be positive again prior to this admission.  Did not have any respiratory issues this time around apart from his diastolic CHF.  Continue to monitor for now.  Chronic atrial fibrillation Continue metoprolol.  He is on warfarin for  anticoagulation.  Pharmacy is dosing.  Chronic kidney disease stage IIIa/hypokalemia Monitor urine output.  Creatinine noted to be stable.  Will be repleted.  Dyslipidemia Continue statin.  Poor oral intake Most likely due to hospitalization and acute illness.  Feeding tube was discussed with the patient and he adamantly declines.  He was told to try to eat better.  We could place him on Megace to see if that would help.  History of dementia Appears to be mild.  Able to answer most of my questions today.    Cholelithiasis Ultrasound showed gallstones with sludge and wall thickening.  Concern for chronic cholecystitis.  However patient asymptomatic.  No abdominal pain.  Abdomen is benign.  His LFTs were unremarkable for the most part.   DVT Prophylaxis: On warfarin PUD Prophylaxis: On Protonix Code Status: Code Status discussed with patient today.  He will be DNR Family Communication: Discussed with patient.  He says that he does not have any family members here. Disposition Plan: Will need to go to skilled nursing facility for short-term rehab.   Medications:  Scheduled: . ARIPiprazole  10 mg Oral QHS  . aspirin EC  81 mg Oral Daily  . chlorpheniramine-HYDROcodone  5 mL Oral Q12H  . feeding supplement (ENSURE ENLIVE)  237 mL Oral BID BM  . metoprolol succinate  75 mg Oral Daily  . pantoprazole  40 mg Oral Daily  . rosuvastatin  20 mg Oral q1800  . vitamin C  500 mg Oral Daily  . Warfarin - Pharmacist Dosing Inpatient   Does not apply q1800  . zinc sulfate  220 mg Oral Daily   Continuous:  XNT:[id number] **OR** acetaminophen, guaiFENesin-dextromethorphan, hydrALAZINE, ondansetron **OR** ondansetron (  ZOFRAN) IV   Objective:  Vital Signs  Vitals:   05/04/19 0500 05/04/19 0600 05/04/19 0700 05/04/19 0728  BP: 110/65   119/67  Pulse: 95 (!) 101 (!) 103 98  Resp:    16  Temp: 98.5 F (36.9 C)   98.4 F (36.9 C)  TempSrc: Axillary   Oral  SpO2: 98% 96% 97% 97%   Weight:      Height:        Intake/Output Summary (Last 24 hours) at 05/04/2019 1209 Last data filed at 05/04/2019 0450 Gross per 24 hour  Intake -  Output 500 ml  Net -500 ml   Filed Weights   04/28/19 1815  Weight: 77.1 kg    General appearance: Awake alert.  In no distress Resp: Normal effort at rest.  Coarse breath sounds with crackles at the bases.  No wheezing or rhonchi. Cardio: S1-S2 is normal regular.  No S3-S4.  No rubs murmurs or bruit GI: Abdomen is soft.  Nontender nondistended.  Bowel sounds are present normal.  No masses organomegaly Extremities: No edema. Neurologic: Alert and oriented x3.  No focal neurological deficits.    Lab Results:  Data Reviewed: I have personally reviewed following labs and imaging studies  CBC: Recent Labs  Lab 04/27/19 1600 04/29/19 0709  WBC 15.8* 12.0*  HGB 10.6* 10.8*  HCT 33.0* 33.2*  MCV 92.7 91.7  PLT 309 443    Basic Metabolic Panel: Recent Labs  Lab 04/28/19 0600 04/29/19 0155 04/30/19 0300 05/02/19 0430 05/04/19 0225  NA 141 141 141 142 142  K 3.3* 3.2* 3.4* 3.5 3.2*  CL 105 104 107 105 101  CO2 24 23 23 22 26   GLUCOSE 104* 92 102* 85 78  BUN 19 19 19 18  25*  CREATININE 1.29* 1.29* 1.24 1.32* 1.39*  CALCIUM 8.6* 8.5* 8.4* 8.8* 9.1    GFR: Estimated Creatinine Clearance: 38.5 mL/min (A) (by C-G formula based on SCr of 1.39 mg/dL (H)).  Liver Function Tests: Recent Labs  Lab 04/27/19 1600 04/28/19 0600 04/29/19 0155 04/30/19 0300  AST 37 42* 45* 33  ALT 36 35 32 28  ALKPHOS 198* 193* 202* 171*  BILITOT 1.0 1.7* 1.4* 0.8  PROT 7.7 7.3 7.6 6.6  ALBUMIN 2.6* 2.7* 2.8* 2.3*    Coagulation Profile: Recent Labs  Lab 04/30/19 0300 05/01/19 0507 05/02/19 0430 05/03/19 0205 05/04/19 0225  INR 3.2* 3.7* 3.8* 3.9* 3.3*    CBG: Recent Labs  Lab 04/29/19 0053  GLUCAP 120*    Radiology Studies: No results found.     LOS: 7 days   Maddy Graham [employer] on  [email or web address]  05/04/2019, 12:09 PM

## 2019-05-04 NOTE — Progress Notes (Signed)
Physical Therapy Treatment Patient Details Name: Marcus Quinn MRN: [id number] DOB: February 04, 1927 Today's Date: 05/04/2019    History of Present Illness 85 year old male past medical history of chronic atrial fibrillation, aortic stenosis (sp TAVR), dyslipidemia, coronary artery disease.Diagnosed with COVID-09 February 2019,had been at [facility], recently DC'd home where he lives alone. Patient  experienced a rapid decline in his physical functional capacity ,dyspnea and lower extremity edema. Patient tested for positive for COVID-19 again. Admitted to [facility] from [facility] 11/4.    PT Comments    The patient is more alert and did mobilize with Rw, amb x 6'. Patient  Encouraged to eat an icee. Continue to progess mobility as able.  Follow Up Recommendations  SNF;Supervision/Assistance - 24 hour     Equipment Recommendations  None recommended by PT    Recommendations for Other Services       Precautions / Restrictions Precautions Precautions: Fall Precaution Comments: check catheter Restrictions Weight Bearing Restrictions: No    Mobility  Bed Mobility Overal bed mobility: Needs Assistance Bed Mobility: Supine to Sit     Supine to sit: Min assist;HOB elevated     General bed mobility comments: use of rails, extra time and delayed in initiation to mobilize, assist for trunk  Transfers Overall transfer level: Needs assistance Equipment used: Rolling walker (2 wheeled) Transfers: Sit to/from Stand Sit to Stand: Min assist;+2 safety/equipment;+2 physical assistance         General transfer comment: cues for hand placement, boosting and steadying assist at RW, pt able to take few steps forwards towards window before turning to sit in recliner, some assist for RW management  Ambulation/Gait Ambulation/Gait assistance: +2 physical assistance;Mod assist Gait Distance (Feet): 6 Feet Assistive device: Rolling walker (2 wheeled) Gait Pattern/deviations: Step-to pattern     General Gait Details:  assistance with Rw to turn and back up.  head very forward.   Stairs             Wheelchair Mobility    Modified Rankin (Stroke Patients Only)       Balance Overall balance assessment: Needs assistance Sitting-balance support: Feet supported;Bilateral upper extremity supported Sitting balance-Leahy Scale: Fair     Standing balance support: During functional activity;Bilateral upper extremity supported Standing balance-Leahy Scale: Poor Standing balance comment: reliant on  UE support., forward leaning                            Cognition Arousal/Alertness: Awake/alert Behavior During Therapy: Flat affect Overall Cognitive Status: No family/caregiver present to determine baseline cognitive functioning Area of Impairment: Attention;Memory;Following commands;Safety/judgement;Awareness;Problem solving                   Current Attention Level: Selective Memory: Decreased short-term memory Following Commands: Follows one step commands inconsistently;Follows one step commands with increased time Safety/Judgement: Decreased awareness of deficits;Decreased awareness of safety Awareness: Intellectual Problem Solving: Slow processing;Decreased initiation;Difficulty sequencing;Requires verbal cues General Comments: appears more alert, willing to get up_ per report, not eating/drinking. offered a  [place] ice which patient left eating.      Exercises      General Comments        Pertinent Vitals/Pain Pain Assessment: Faces Faces Pain Scale: Hurts a little bit Pain Location: generalized with mobility Pain Descriptors / Indicators: Discomfort Pain Intervention(s): Monitored during session    Home Living  Prior Function            PT Goals (current goals can now be found in the care plan section) Acute Rehab PT Goals Patient Stated Goal: agreed to getting to recliner Progress towards PT goals: Progressing toward  goals    Frequency    Min 2X/week      PT Plan Current plan remains appropriate    Co-evaluation PT/OT/SLP Co-Evaluation/Treatment: Yes Reason for Co-Treatment: For patient/therapist safety PT goals addressed during session: Mobility/safety with mobility OT goals addressed during session: ADL's and self-care      AM-PAC PT "6 Clicks" Mobility   Outcome Measure  Help needed turning from your back to your side while in a flat bed without using bedrails?: A Little Help needed moving from lying on your back to sitting on the side of a flat bed without using bedrails?: A Little Help needed moving to and from a bed to a chair (including a wheelchair)?: A Lot Help needed standing up from a chair using your arms (e.g., wheelchair or bedside chair)?: A Lot Help needed to walk in hospital room?: A Lot Help needed climbing 3-5 steps with a railing? : Total 6 Click Score: 13    End of Session Equipment Utilized During Treatment: Gait belt Activity Tolerance: Patient limited by fatigue;Patient limited by lethargy Patient left: in chair;with call bell/phone within reach;with chair alarm set Nurse Communication: Mobility status PT Visit Diagnosis: Muscle weakness (generalized) (M62.81);Unsteadiness on feet (R26.81);Difficulty in walking, not elsewhere classified (R26.2)     Time: 4235-3614 PT Time Calculation (min) (ACUTE ONLY): 21 min  Charges:                        Tresa Endo PT Acute Rehabilitation Services Pager [phone] Office [phone]    Claretha Cooper 05/04/2019, 3:05 PM

## 2019-05-04 NOTE — Progress Notes (Signed)
Occupational Therapy Treatment Patient Details Name: Marcus Quinn MRN: [id number] DOB: 1926-10-18 Today's Date: 05/04/2019    History of present illness 85 year old male past medical history of chronic atrial fibrillation, aortic stenosis (sp TAVR), dyslipidemia, coronary artery disease.Diagnosed with COVID-09 February 2019,had been at [facility], recently [facility] home where he lives alone. Patient  experienced a rapid decline in his physical functional capacity ,dyspnea and lower extremity edema. Patient tested for positive for COVID-19 again. Admitted to [facility] from [facility].   OT comments  Pt making slow progress towards OT goals, but agreeable to working/OOB with therapies. Pt requiring minA(+2) for functional transfers using RW, tolerating taking a few steps towards window prior to needing to sit and rest due to fatigue. Pt on RA during session with VSS. He continues to require maxA for LB ADL, setup/minguard for seated grooming ADL. Encouraged PO intake as RN reports pt has often been refusing his meals, pt agreeable and eating ice pop end of session. Will continue per POC at this time.    Follow Up Recommendations  SNF;Supervision/Assistance - 24 hour    Equipment Recommendations  Other (comment)(TBD)          Precautions / Restrictions Precautions Precautions: Fall Precaution Comments: check catheter Restrictions Weight Bearing Restrictions: No       Mobility Bed Mobility Overal bed mobility: Needs Assistance Bed Mobility: Supine to Sit     Supine to sit: Min assist;HOB elevated     General bed mobility comments: use of rails, extra time and delayed in initiation to mobilize, assist for trunk  Transfers Overall transfer level: Needs assistance Equipment used: Rolling walker (2 wheeled) Transfers: Sit to/from Stand Sit to Stand: Min assist;+2 safety/equipment;+2 physical assistance         General transfer comment: cues for hand placement, boosting and steadying assist at RW, pt  able to take few steps forwards towards window before turning to sit in recliner, some assist for RW management    Balance Overall balance assessment: Needs assistance Sitting-balance support: Feet supported;Bilateral upper extremity supported Sitting balance-Leahy Scale: Fair     Standing balance support: During functional activity;Bilateral upper extremity supported Standing balance-Leahy Scale: Poor Standing balance comment: reliant on  UE support., forward leaning                           ADL either performed or assessed with clinical judgement   ADL Overall ADL's : Needs assistance/impaired Eating/Feeding: Set up;Sitting Eating/Feeding Details (indicate cue type and reason): encouraged PO intake as RN reports pt has been refusing meals, pt eating ice pop end of session Grooming: Set up;Min guard;Wash/dry face;Sitting               Lower Body Dressing: Maximal assistance;+2 for safety/equipment;Sit to/from stand Lower Body Dressing Details (indicate cue type and reason): assist to don socks at bed level this session             Functional mobility during ADLs: Minimal assistance;+2 for physical assistance;+2 for safety/equipment;Rolling walker General ADL Comments: pt remains weak and deconditioned, tolerated OOB to recliner during session but only able to tolerate mobiliy for short period of time     Vision       Perception     Praxis      Cognition Arousal/Alertness: Awake/alert Behavior During Therapy: Flat affect Overall Cognitive Status: No family/caregiver present to determine baseline cognitive functioning Area of Impairment: Attention;Memory;Following commands;Safety/judgement;Awareness;Problem solving  Current Attention Level: Selective Memory: Decreased short-term memory Following Commands: Follows one step commands inconsistently;Follows one step commands with increased time Safety/Judgement: Decreased  awareness of deficits;Decreased awareness of safety Awareness: Intellectual Problem Solving: Slow processing;Decreased initiation;Difficulty sequencing;Requires verbal cues          Exercises     Shoulder Instructions       General Comments      Pertinent Vitals/ Pain       Pain Assessment: Faces Faces Pain Scale: Hurts a little bit Pain Location: generalized with mobility Pain Descriptors / Indicators: Discomfort Pain Intervention(s): Monitored during session;Limited activity within patient's tolerance;Repositioned  Home Living                                          Prior Functioning/Environment              Frequency  Min 2X/week        Progress Toward Goals  OT Goals(current goals can now be found in the care plan section)  Progress towards OT goals: Progressing toward goals  Acute Rehab OT Goals Patient Stated Goal: agreed to getting to recliner OT Goal Formulation: With patient Time For Goal Achievement: 05/13/19 Potential to Achieve Goals: Good ADL Goals Pt Will Perform Grooming: with supervision;sitting Pt Will Perform Lower Body Bathing: sit to/from stand;with min guard assist Pt Will Perform Upper Body Dressing: with supervision;with set-up;sitting Pt Will Perform Lower Body Dressing: sit to/from stand;with min guard assist Pt Will Transfer to Toilet: with min guard assist;ambulating;stand pivot transfer Pt Will Perform Toileting - Clothing Manipulation and hygiene: with min guard assist;sit to/from stand  Plan Discharge plan remains appropriate    Co-evaluation    PT/OT/SLP Co-Evaluation/Treatment: Yes Reason for Co-Treatment: To address functional/ADL transfers   OT goals addressed during session: ADL's and self-care      AM-PAC OT "6 Clicks" Daily Activity     Outcome Measure   Help from another person eating meals?: A Little Help from another person taking care of personal grooming?: A Little Help from another  person toileting, which includes using toliet, bedpan, or urinal?: A Lot Help from another person bathing (including washing, rinsing, drying)?: A Lot Help from another person to put on and taking off regular upper body clothing?: A Little Help from another person to put on and taking off regular lower body clothing?: A Lot 6 Click Score: 15    End of Session Equipment Utilized During Treatment: Gait belt;Rolling walker  OT Visit Diagnosis: Unsteadiness on feet (R26.81);Muscle weakness (generalized) (M62.81);Other symptoms and signs involving cognitive function   Activity Tolerance Patient tolerated treatment well;Patient limited by fatigue   Patient Left in chair;with call bell/phone within reach;with chair alarm set   Nurse Communication Mobility status        Time: 6384-6659 OT Time Calculation (min): 23 min  Charges: OT General Charges $OT Visit: 1 Visit OT Treatments $Self Care/Home Management : 8-22 mins  Marcy Siren, OT Supplemental Rehabilitation Services Pager [phone] Office [phone]   Orlando Penner 05/04/2019, 2:38 PM

## 2019-05-04 NOTE — Progress Notes (Signed)
Called and spoke with the patient's contact person Remo Lipps. I updated him on the code status change, plan of care, and medications. He denied any other concerns at this time.

## 2019-05-05 LAB — COMPREHENSIVE METABOLIC PANEL
ALT: 27 U/L (ref 0–44)
AST: 42 U/L — ABNORMAL HIGH (ref 15–41)
Albumin: 2.6 g/dL — ABNORMAL LOW (ref 3.5–5.0)
Alkaline Phosphatase: 216 U/L — ABNORMAL HIGH (ref 38–126)
Anion gap: 12 (ref 5–15)
BUN: 30 mg/dL — ABNORMAL HIGH (ref 8–23)
CO2: 28 mmol/L (ref 22–32)
Calcium: 9 mg/dL (ref 8.9–10.3)
Chloride: 103 mmol/L (ref 98–111)
Creatinine, Ser: 1.46 mg/dL — ABNORMAL HIGH (ref 0.61–1.24)
GFR calc Af Amer: 48 mL/min — ABNORMAL LOW (ref >60)
GFR calc non Af Amer: 42 mL/min — ABNORMAL LOW (ref >60)
Glucose, Bld: 132 mg/dL — ABNORMAL HIGH (ref 70–99)
Potassium: 3.1 mmol/L — ABNORMAL LOW (ref 3.5–5.1)
Sodium: 143 mmol/L (ref 135–145)
Total Bilirubin: 1 mg/dL (ref 0.3–1.2)
Total Protein: 8.1 g/dL (ref 6.5–8.1)

## 2019-05-05 LAB — CBC
HCT: 36.2 % — ABNORMAL LOW (ref 39.0–52.0)
Hemoglobin: 11.4 g/dL — ABNORMAL LOW (ref 13.0–17.0)
MCH: 28.9 pg (ref 26.0–34.0)
MCHC: 31.5 g/dL (ref 30.0–36.0)
MCV: 91.6 fL (ref 80.0–100.0)
Platelets: 383 10*3/uL (ref 150–400)
RBC: 3.95 MIL/uL — ABNORMAL LOW (ref 4.22–5.81)
RDW: 14.3 % (ref 11.5–15.5)
WBC: 16.7 10*3/uL — ABNORMAL HIGH (ref 4.0–10.5)
nRBC: 0 % (ref 0.0–0.2)

## 2019-05-05 LAB — MAGNESIUM: Magnesium: 2.2 mg/dL (ref 1.7–2.4)

## 2019-05-05 LAB — PROTIME-INR
INR: 2.9 — ABNORMAL HIGH (ref 0.8–1.2)
Prothrombin Time: 30.1 seconds — ABNORMAL HIGH (ref 11.4–15.2)

## 2019-05-05 MED ORDER — WARFARIN SODIUM 1 MG PO TABS
1.0000 mg | ORAL_TABLET | Freq: Once | ORAL | Status: AC
  Administered 2019-05-05: 1 mg via ORAL
  Filled 2019-05-05: qty 1

## 2019-05-05 MED ORDER — POTASSIUM CHLORIDE 20 MEQ/15ML (10%) PO SOLN
40.0000 meq | Freq: Two times a day (BID) | ORAL | Status: AC
  Administered 2019-05-05 (×2): 40 meq via ORAL
  Filled 2019-05-05 (×2): qty 30

## 2019-05-05 NOTE — Progress Notes (Signed)
Pt pushing hands away and refusing Covid testing.  Pt educated on reason for needing Covid test and still refuses.

## 2019-05-05 NOTE — Progress Notes (Signed)
ANTICOAGULATION CONSULT NOTE - Follow Up Consult  Pharmacy Consult for Warfarin Indication: atrial fibrillation  No Known Allergies  Patient Measurements: Height: 6' (182.9 cm) Weight: 170 lb (77.1 kg) IBW/kg (Calculated) : 77.6  Vital Signs: Temp: 97.9 F (36.6 C) (11/12 0820) Temp Source: Oral (11/12 0820) BP: 103/65 (11/12 0820) Pulse Rate: 105 (11/12 0820)  Labs: Recent Labs    05/03/19 0205 05/04/19 0225 05/05/19 0500  HGB  --   --  11.4*  HCT  --   --  36.2*  PLT  --   --  383  LABPROT 37.5* 33.2* 30.1*  INR 3.9* 3.3* 2.9*  CREATININE  --  1.39* 1.46*    Estimated Creatinine Clearance: 36.7 mL/min (A) (by C-G formula based on SCr of 1.46 mg/dL (H)).   Medications:  Scheduled:  . ARIPiprazole  10 mg Oral QHS  . aspirin EC  81 mg Oral Daily  . chlorpheniramine-HYDROcodone  5 mL Oral Q12H  . feeding supplement (ENSURE ENLIVE)  237 mL Oral BID BM  . megestrol  400 mg Oral Daily  . metoprolol succinate  75 mg Oral Daily  . pantoprazole  40 mg Oral Daily  . potassium chloride  40 mEq Oral BID  . rosuvastatin  20 mg Oral q1800  . vitamin C  500 mg Oral Daily  . Warfarin - Pharmacist Dosing Inpatient   Does not apply q1800  . zinc sulfate  220 mg Oral Daily   Infusions:    Assessment: 89 yoM admitted to [facility] from [facility] with +COVID-19.  PMH is significant for chronic Afib on warfarin, dyslipidemia, CAD, andaortic stenosis s/p TAVR.  Pharmacy has been consulted to continue warfarin dosing.  PTA warfarin dose is 5mg  daily.  Today, 05/05/2019  INR is now in therapeutic range at 2.9 (dose held x 5 days in a row)  No bleeding or complications reported. Of note, megace can potentially counteract warfarin effects   Patient has minimal oral intake per charting. Megace started to help improve appetite   Goal of Therapy:  INR 2-3 Monitor platelets by anticoagulation protocol: Yes   Plan:   Warfarin 1 mg today   Daily PT/INR.  Monitor for  signs and symptoms of bleeding.  Albertina Parr, PharmD., BCPS Clinical Pharmacist Clinical phone for 05/05/19 until 5pm: [phone]

## 2019-05-05 NOTE — Progress Notes (Signed)
Nutrition Follow-up  DOCUMENTATION CODES:   Not applicable  INTERVENTION:    Continue to offer Ensure Enlive po BID, each supplement provides 350 kcal and 20 grams of protein.  Continue Hormel Shake daily with Breakfast which provides 520 kcals and 22 g of protein and Magic cup BID with lunch and dinner, each supplement provides 290 kcal and 9 grams of protein, automatically on meal trays to optimize nutritional intake.   NUTRITION DIAGNOSIS:   Increased nutrient needs related to acute illness as evidenced by estimated needs.  Ongoing   GOAL:   Patient will meet greater than or equal to 90% of their needs  Unmet   MONITOR:   PO intake, Supplement acceptance, Labs, Weight trends, I & O's  ASSESSMENT:   85 year old male past medical history of chronic atrial fibrillation, aortic stenosis (sp TAVR), dyslipidemia, coronary artery disease.  Recently diagnosed with COVID-09 February 2019, apparently he has been at a skilled nursing facility and discharged posteriorly home.  At his home he lives alone, he experienced a rapid decline in his physical functional capacity associated with dyspnea and lower extremity edema.Patient was admitted to the hospital with working diagnosis right heart failure, acute on chronic, in the setting of positive COVID-19 PCR.  Patient is refusing a repeat COVID test. He refused a feeding tube yesterday. Intake remains poor, consuming 0% of meals, regular diet. He is being offered Ensure Enlive BID, but is refusing it most of the time.  Megace added to stimulate appetite.  Labs and medications reviewed.  Low potassium is being replaced. Noted guarded prognosis.   Diet Order:   Diet Order            Diet regular Room service appropriate? Yes; Fluid consistency: Thin  Diet effective now        Diet - low sodium heart healthy              EDUCATION NEEDS:   No education needs have been identified at this time  Skin:  Skin Assessment: Reviewed  RN Assessment  Last BM:  11/5 - type 5  Height:   Ht Readings from Last 1 Encounters:  04/28/19 6' (1.829 m)    Weight:   Wt Readings from Last 1 Encounters:  04/28/19 77.1 kg    Ideal Body Weight:  80.9 kg  BMI:  Body mass index is 23.06 kg/m.  Estimated Nutritional Needs:   Kcal:  1800-2000  Protein:  85-95g  Fluid:  1.8L/day    Molli Barrows, RD, LDN, [place] Pager [phone] After Hours Pager [phone]

## 2019-05-05 NOTE — Progress Notes (Signed)
PROGRESS NOTE  Marcus Quinn ZOX:[id number] DOB: 1926-11-24 DOA: 04/27/2019  PCP: Doreene Eland, MD  Brief History/Interval Summary: 85 year old male past medical history of chronic atrial fibrillation, aortic stenosis(spTAVR),dyslipidemia, coronary artery disease.Recently diagnosed with COVID-09 February 2019,apparently he has been at a skilled nursing facility and then discharged home. At his home he lives alone, he experienced a rapid decline in his physical functional capacity associated with dyspnea and lower extremity edema. CT chest with bilateral pleural effusions,more on theleft. Abnormal appearance of the gallbladder.Possible acute cholecystitis. Patient was admitted to the hospital with working diagnosisofright heart failure,acute on chronic, in the setting of positive COVID-19 PCR.  Reason for Visit: Acute on chronic diastolic CHF.  Consultants: None  Procedures: None  Antibiotics: Anti-infectives (From admission, onward)   None      Subjective/Interval History: Patient somewhat distracted this morning.  Apparently he refused to undergo the COVID-19 test yesterday.  His oral intake remains poor.  He declined feeding tube yesterday.      Assessment/Plan:  Acute on chronic diastolic CHF Patient had bilateral pleural effusion on CT scan.  He was diuresed with Lasix.  He improved.  Due to poor oral intake Lasix was stopped.  Continue to monitor volume status which remained stable.  Continue beta-blocker.  Leukocytosis could be due to megestrol.  COVID-19 infection Initially positive in August.  Also noted to be positive again prior to this admission.  Stable respiratory status.  He is afebrile.  Repeat COVID-19 test was ordered yesterday is to facilitate placement however patient has refused.  He continues to adamantly refuse being retested.  Chronic atrial fibrillation Continue metoprolol.  He is on warfarin for anticoagulation.  Pharmacy is dosing.   Chronic kidney disease stage IIIa/hypokalemia Renal function slightly worsened over the last 3 to 4 days but stable for the most part.  Potassium noted to be low again.  Will be repleted.  Magnesium is 2.2.  Dyslipidemia Continue statin.  Poor oral intake Continues to have very poor oral intake.  Most likely due to acute illness.  He denies any abdominal pain.  He does have cholelithiasis but his right upper quadrant is benign.  Nonspecific abnormalities in LFT noted.  Unlikely that cholelithiasis is contributing to his poor oral intake.  Megace was initiated to see if it would help.    History of dementia Appears to be mild.    Cholelithiasis Ultrasound showed gallstones with sludge and wall thickening.  Concern for chronic cholecystitis.  However patient asymptomatic.  Abdomen remains benign.   DVT Prophylaxis: On warfarin PUD Prophylaxis: On Protonix Code Status: DNR Family Communication: Patient apparently does not have any family members.  There is a friend that the nursing staff has been contacting.  We will try to call him today.   Disposition Plan: Skilled nursing facility has been recommended.  A repeat COVID-19 test was ordered which the patient has been refusing.  I tried to reason with him today but he adamantly refuses.  He appears to be declining.  Becoming more and more weak.  Prognosis is guarded at this time.     Medications:  Scheduled: . ARIPiprazole  10 mg Oral QHS  . aspirin EC  81 mg Oral Daily  . chlorpheniramine-HYDROcodone  5 mL Oral Q12H  . feeding supplement (ENSURE ENLIVE)  237 mL Oral BID BM  . megestrol  400 mg Oral Daily  . metoprolol succinate  75 mg Oral Daily  . pantoprazole  40 mg Oral Daily  . potassium  chloride  40 mEq Oral BID  . rosuvastatin  20 mg Oral q1800  . vitamin C  500 mg Oral Daily  . warfarin  1 mg Oral ONCE-1800  . Warfarin - Pharmacist Dosing Inpatient   Does not apply q1800  . zinc sulfate  220 mg Oral Daily   Continuous:   YDX:AJOINOMVEHMCN **OR** acetaminophen, guaiFENesin-dextromethorphan, hydrALAZINE, ondansetron **OR** ondansetron (ZOFRAN) IV   Objective:  Vital Signs  Vitals:   05/04/19 1542 05/04/19 1916 05/05/19 0423 05/05/19 0820  BP: (!) 99/55 129/74 123/81 103/65  Pulse: (!) 103 (!) 112 (!) 110 (!) 105  Resp: 14   20  Temp: 98.3 F (36.8 C) 97.9 F (36.6 C) 98.5 F (36.9 C) 97.9 F (36.6 C)  TempSrc: Axillary Oral Oral Oral  SpO2: 98% 98% 96% 96%  Weight:      Height:        Intake/Output Summary (Last 24 hours) at 05/05/2019 1050 Last data filed at 05/05/2019 0526 Gross per 24 hour  Intake -  Output 400 ml  Net -400 ml   Filed Weights   04/28/19 1815  Weight: 77.1 kg    General appearance: Lethargic but easily arousable.  Resp: Clear to auscultation bilaterally.  Normal effort Cardio: S1-S2 is normal regular.  No S3-S4.  No rubs murmurs or bruit GI: Abdomen is soft.  Nontender nondistended.  Specifically nontender in the right upper quadrant.  Bowel sounds are present normal.  No masses organomegaly Extremities: No edema.  Neurologic: No obvious focal neurological deficits.   Lab Results:  Data Reviewed: I have personally reviewed following labs and imaging studies  CBC: Recent Labs  Lab 04/29/19 0709 05/05/19 0500  WBC 12.0* 16.7*  HGB 10.8* 11.4*  HCT 33.2* 36.2*  MCV 91.7 91.6  PLT 319 470    Basic Metabolic Panel: Recent Labs  Lab 04/29/19 0155 04/30/19 0300 05/02/19 0430 05/04/19 0225 05/05/19 0500  NA 141 141 142 142 143  K 3.2* 3.4* 3.5 3.2* 3.1*  CL 104 107 105 101 103  CO2 23 23 22 26 28   GLUCOSE 92 102* 85 78 132*  BUN 19 19 18  25* 30*  CREATININE 1.29* 1.24 1.32* 1.39* 1.46*  CALCIUM 8.5* 8.4* 8.8* 9.1 9.0  MG  --   --   --   --  2.2    GFR: Estimated Creatinine Clearance: 36.7 mL/min (A) (by C-G formula based on SCr of 1.46 mg/dL (H)).  Liver Function Tests: Recent Labs  Lab 04/29/19 0155 04/30/19 0300 05/05/19 0500  AST 45*  33 42*  ALT 32 28 27  ALKPHOS 202* 171* 216*  BILITOT 1.4* 0.8 1.0  PROT 7.6 6.6 8.1  ALBUMIN 2.8* 2.3* 2.6*    Coagulation Profile: Recent Labs  Lab 05/01/19 0507 05/02/19 0430 05/03/19 0205 05/04/19 0225 05/05/19 0500  INR 3.7* 3.8* 3.9* 3.3* 2.9*    CBG: Recent Labs  Lab 04/29/19 0053  GLUCAP 120*    Radiology Studies: No results found.     LOS: 8 days   Josefita Weissmann [employer] on [email or web address]  05/05/2019, 10:50 AM

## 2019-05-06 LAB — PROTIME-INR
INR: 3 — ABNORMAL HIGH (ref 0.8–1.2)
Prothrombin Time: 30.4 seconds — ABNORMAL HIGH (ref 11.4–15.2)

## 2019-05-06 LAB — CBC
HCT: 33.1 % — ABNORMAL LOW (ref 39.0–52.0)
Hemoglobin: 10.5 g/dL — ABNORMAL LOW (ref 13.0–17.0)
MCH: 29 pg (ref 26.0–34.0)
MCHC: 31.7 g/dL (ref 30.0–36.0)
MCV: 91.4 fL (ref 80.0–100.0)
Platelets: 357 10*3/uL (ref 150–400)
RBC: 3.62 MIL/uL — ABNORMAL LOW (ref 4.22–5.81)
RDW: 14.1 % (ref 11.5–15.5)
WBC: 12 10*3/uL — ABNORMAL HIGH (ref 4.0–10.5)
nRBC: 0 % (ref 0.0–0.2)

## 2019-05-06 LAB — COMPREHENSIVE METABOLIC PANEL
ALT: 30 U/L (ref 0–44)
AST: 55 U/L — ABNORMAL HIGH (ref 15–41)
Albumin: 2.3 g/dL — ABNORMAL LOW (ref 3.5–5.0)
Alkaline Phosphatase: 204 U/L — ABNORMAL HIGH (ref 38–126)
Anion gap: 12 (ref 5–15)
BUN: 30 mg/dL — ABNORMAL HIGH (ref 8–23)
CO2: 26 mmol/L (ref 22–32)
Calcium: 8.9 mg/dL (ref 8.9–10.3)
Chloride: 107 mmol/L (ref 98–111)
Creatinine, Ser: 1.44 mg/dL — ABNORMAL HIGH (ref 0.61–1.24)
GFR calc Af Amer: 49 mL/min — ABNORMAL LOW (ref >60)
GFR calc non Af Amer: 42 mL/min — ABNORMAL LOW (ref >60)
Glucose, Bld: 89 mg/dL (ref 70–99)
Potassium: 3.1 mmol/L — ABNORMAL LOW (ref 3.5–5.1)
Sodium: 145 mmol/L (ref 135–145)
Total Bilirubin: 0.7 mg/dL (ref 0.3–1.2)
Total Protein: 7.3 g/dL (ref 6.5–8.1)

## 2019-05-06 MED ORDER — POTASSIUM CHLORIDE 20 MEQ/15ML (10%) PO SOLN
40.0000 meq | Freq: Two times a day (BID) | ORAL | Status: DC
  Administered 2019-05-06: 40 meq via ORAL
  Filled 2019-05-06 (×2): qty 30

## 2019-05-06 MED ORDER — WARFARIN 0.5 MG HALF TABLET
0.5000 mg | ORAL_TABLET | Freq: Once | ORAL | Status: AC
  Administered 2019-05-06: 0.5 mg via ORAL
  Filled 2019-05-06: qty 1

## 2019-05-06 NOTE — Progress Notes (Signed)
ANTICOAGULATION CONSULT NOTE - Follow Up Consult  Pharmacy Consult for Warfarin Indication: atrial fibrillation  No Known Allergies  Patient Measurements: Height: 6' (182.9 cm) Weight: 170 lb (77.1 kg) IBW/kg (Calculated) : 77.6  Vital Signs: Temp: 97.8 F (36.6 C) (11/13 0724) Temp Source: Oral (11/13 0724) BP: 104/71 (11/13 0724) Pulse Rate: 58 (11/13 0724)  Labs: Recent Labs    05/04/19 0225 05/05/19 0500 05/06/19 0128  HGB  --  11.4* 10.5*  HCT  --  36.2* 33.1*  PLT  --  383 357  LABPROT 33.2* 30.1* 30.4*  INR 3.3* 2.9* 3.0*  CREATININE 1.39* 1.46* 1.44*    Estimated Creatinine Clearance: 37.2 mL/min (A) (by C-G formula based on SCr of 1.44 mg/dL (H)).   Medications:  Scheduled:  . ARIPiprazole  10 mg Oral QHS  . aspirin EC  81 mg Oral Daily  . chlorpheniramine-HYDROcodone  5 mL Oral Q12H  . feeding supplement (ENSURE ENLIVE)  237 mL Oral BID BM  . megestrol  400 mg Oral Daily  . metoprolol succinate  75 mg Oral Daily  . pantoprazole  40 mg Oral Daily  . potassium chloride  40 mEq Oral BID  . rosuvastatin  20 mg Oral q1800  . vitamin C  500 mg Oral Daily  . Warfarin - Pharmacist Dosing Inpatient   Does not apply q1800  . zinc sulfate  220 mg Oral Daily   Infusions:    Assessment: 89 yoM admitted to [place] from [facility] with +COVID-19.  PMH is significant for chronic Afib on warfarin, dyslipidemia, CAD, andaortic stenosis s/p TAVR.  Pharmacy has been consulted to continue warfarin dosing.  PTA warfarin dose is 5mg  daily.  Today, 05/06/2019  INR remains in therapeutic range but up to 3 after 1 dose (dose held from 11/7-11/11)  No bleeding or complications reported. Of note, megace can potentially counteract warfarin effects   Patient has minimal oral intake per charting consuming 0% of meals. Megace started to help improve appetite   Goal of Therapy:  INR 2-3 Monitor platelets by anticoagulation protocol: Yes   Plan:   Warfarin 0.5 mg  today as patient seems extremely sensitive  Daily PT/INR.  Monitor for signs and symptoms of bleeding.  Albertina Parr, PharmD., BCPS Clinical Pharmacist Clinical phone for 05/06/19 until 5pm: [phone]

## 2019-05-06 NOTE — Progress Notes (Signed)
CSW still working on placement for patient- currently no SNF facilities agreeable to accept patient under [facility] benefits. [facility] requested new covid test to see if patient may be negative to produce more SNF options. CSW will continue to follow up.   Kingsley Spittle, LCSW [facility]  [phone]

## 2019-05-06 NOTE — Progress Notes (Addendum)
PROGRESS NOTE  Marcus Quinn [id number] DOB: 1927-04-06 DOA: 04/27/2019  PCP: Marcus Elandhomas, Millard B, MD  Brief History/Interval Summary: 85 year old male past medical history of chronic atrial fibrillation, aortic stenosis(spTAVR),dyslipidemia, coronary artery disease.Recently diagnosed with COVID-09 February 2019,apparently he has been at a skilled nursing facility and then discharged home. At his home he lives alone, he experienced a rapid decline in his physical functional capacity associated with dyspnea and lower extremity edema. CT chest with bilateral pleural effusions,more on theleft. Abnormal appearance of the gallbladder.Possible acute cholecystitis. Patient was admitted to the hospital with working diagnosisofright heart failure,acute on chronic, in the setting of positive COVID-19 PCR.  Reason for Visit: Acute on chronic diastolic CHF.  Consultants: None  Procedures: None  Antibiotics: Anti-infectives (From admission, onward)   None      Subjective/Interval History: Patient not very communicative.  Remains distracted.  States that he is a bit hungry today.  He will try to eat food.  Yesterday he refused repeat COVID-19 test.    Assessment/Plan:  Acute on chronic diastolic CHF Patient had bilateral pleural effusion on CT scan.  He was diuresed with Lasix.  He improved.  Due to poor oral intake Lasix was stopped.  Volume status remained stable.  Continue beta-blocker.  Leukocytosis most likely due to megestrol.  Noted to be better this morning.  Continue to monitor.    COVID-19 infection Initially positive in August.  Also noted to be positive again prior to this admission.  Respiratory status is stable.  Repeat COVID-19 test was ordered to assist with placement however patient has adamantly refused.  He refused even when I spoke to him about it and he does not want to be retested.  Will talk to him again tomorrow.   Poor oral intake Continues to have very poor  oral intake.  Declines a feeding tube.  He denies any abdominal pain.  He does have cholelithiasis but appears to be asymptomatic from it.  Unlikely that this is contributing.  LFTs are mildly abnormal but stable.  He was started on megestrol to see if it improves his appetite.  Continue to monitor.     Chronic atrial fibrillation Continue metoprolol.  He is on warfarin for anticoagulation.  Pharmacy is dosing.  Chronic kidney disease stage IIIa/hypokalemia Renal function is stable for the most part.  Potassium again noted to be low and will be repleted.  Magnesium was 2.2 yesterday.    Dyslipidemia Continue statin.  History of dementia Appears to be mild.    Cholelithiasis Ultrasound showed gallstones with sludge and wall thickening.  Concern for chronic cholecystitis.  However patient asymptomatic.  Abdomen remains benign  ADDENDUM I was able to get a hold of Mr. Marcus AndreasStephen Quinn who is patient's cousin who lives in [place].  Patient has another contact person who is [name] who lives in [place] who is the patient's cousin's husband.  Marcus SeniorStephen does not know who would be the decision maker if the patient was not able to decide for himself.  He thinks that he and Marcus KidaJack are the co-executors of the patient's estate but he is not sure.  He does not know if the patient has made any power of attorney documents. He will reach out to [place] to determine this. We will need to discuss this with the patient.  We will need to involve Child psychotherapistsocial worker as well.  DVT Prophylaxis: On warfarin PUD Prophylaxis: On Protonix Code Status: DNR Family Communication: Patient does not have any family members  in town.  Apparently has a cousin in [place] whom I have not been able to reach. Disposition Plan: Skilled nursing facility has been recommended.  A repeat COVID-19 test was ordered which the patient has been refusing.  I tried to reason with him but continues to refuse.  Waiting for his appetite improved.   Prognosis is guarded.     Medications:  Scheduled: . ARIPiprazole  10 mg Oral QHS  . aspirin EC  81 mg Oral Daily  . chlorpheniramine-HYDROcodone  5 mL Oral Q12H  . feeding supplement (ENSURE ENLIVE)  237 mL Oral BID BM  . megestrol  400 mg Oral Daily  . metoprolol succinate  75 mg Oral Daily  . pantoprazole  40 mg Oral Daily  . potassium chloride  40 mEq Oral BID  . rosuvastatin  20 mg Oral q1800  . vitamin C  500 mg Oral Daily  . warfarin  0.5 mg Oral ONCE-1800  . Warfarin - Pharmacist Dosing Inpatient   Does not apply q1800  . zinc sulfate  220 mg Oral Daily   Continuous:  MOQ:[id number] **OR** acetaminophen, guaiFENesin-dextromethorphan, hydrALAZINE, ondansetron **OR** ondansetron (ZOFRAN) IV   Objective:  Vital Signs  Vitals:   05/05/19 1557 05/05/19 2024 05/06/19 0440 05/06/19 0724  BP: 114/83 113/79 119/66 104/71  Pulse: 99 (!) 109 (!) 103 (!) 58  Resp:      Temp: 97.8 F (36.6 C) 98.6 F (37 C) 97.7 F (36.5 C) 97.8 F (36.6 C)  TempSrc: Oral Oral Oral Oral  SpO2: 97% 97% 96% 97%  Weight:      Height:       No intake or output data in the 24 hours ending 05/06/19 1046 Filed Weights   04/28/19 1815  Weight: 77.1 kg    General appearance: Lethargic but easily arousable.  In no distress Resp: Clear to auscultation bilaterally.  Normal effort Cardio: S1-S2 is normal regular.  No S3-S4.  No rubs murmurs or bruit GI: Abdomen is soft.  Nontender nondistended.  Bowel sounds are present normal.  No masses organomegaly Extremities: No edema.  Full range of motion of lower extremities. Neurologic: No focal neurological deficit    Lab Results:  Data Reviewed: I have personally reviewed following labs and imaging studies  CBC: Recent Labs  Lab 05/05/19 0500 05/06/19 0128  WBC 16.7* 12.0*  HGB 11.4* 10.5*  HCT 36.2* 33.1*  MCV 91.6 91.4  PLT 383 568    Basic Metabolic Panel: Recent Labs  Lab 04/30/19 0300 05/02/19 0430 05/04/19 0225 05/05/19  0500 05/06/19 0128  NA 141 142 142 143 145  K 3.4* 3.5 3.2* 3.1* 3.1*  CL 107 105 101 103 107  CO2 23 22 26 28 26   GLUCOSE 102* 85 78 132* 89  BUN 19 18 25* 30* 30*  CREATININE 1.24 1.32* 1.39* 1.46* 1.44*  CALCIUM 8.4* 8.8* 9.1 9.0 8.9  MG  --   --   --  2.2  --     GFR: Estimated Creatinine Clearance: 37.2 mL/min (A) (by C-G formula based on SCr of 1.44 mg/dL (H)).  Liver Function Tests: Recent Labs  Lab 04/30/19 0300 05/05/19 0500 05/06/19 0128  AST 33 42* 55*  ALT 28 27 30   ALKPHOS 171* 216* 204*  BILITOT 0.8 1.0 0.7  PROT 6.6 8.1 7.3  ALBUMIN 2.3* 2.6* 2.3*    Coagulation Profile: Recent Labs  Lab 05/02/19 0430 05/03/19 0205 05/04/19 0225 05/05/19 0500 05/06/19 0128  INR 3.8* 3.9* 3.3* 2.9* 3.0*  CBG: No results for input(s): GLUCAP in the last 168 hours.  Radiology Studies: No results found.     LOS: 9 days   Savannah Erbe [employer] on [email or web address]  05/06/2019, 10:46 AM

## 2019-05-06 NOTE — Progress Notes (Signed)
Physical Therapy Treatment Patient Details Name: Marcus Quinn MRN: [id number] DOB: 1926-08-23 Today's Date: 05/06/2019    History of Present Illness 85 year old male past medical history of chronic atrial fibrillation, aortic stenosis (sp TAVR), dyslipidemia, coronary artery disease.Diagnosed with COVID-09 February 2019,had been at [facility], recently DC'd home where he lives alone. Patient  experienced a rapid decline in his physical functional capacity ,dyspnea and lower extremity edema. Patient tested for positive for COVID-19 again. Admitted to [facility] 11/4.    PT Comments    Pt did poorly with therapist this pm, he needed max a x 2 to complete sit<>stand and stand pivot from recliner to bed. Once sitting edge of bed was able to scoot himself back to bed with no assistance, but was not able to return to bed without mod a to move BLE back on. Once in bed unable to scoot himself up needing max a x2 to scoot up in bed. Nurse reports he has been refusing to eat or take his medication this day.    Follow Up Recommendations  SNF;Supervision/Assistance - 24 hour     Equipment Recommendations  None recommended by PT    Recommendations for Other Services       Precautions / Restrictions Precautions Precautions: Fall Precaution Comments: has removed cather and refusing new one Restrictions Weight Bearing Restrictions: No    Mobility  Bed Mobility Overal bed mobility: Needs Assistance Bed Mobility: Sit to Supine       Sit to supine: Mod assist   General bed mobility comments: states needs assist to bed but once sitting EOB, able to scoot back but needs assist to move legs back on to bed  Transfers Overall transfer level: Needs assistance Equipment used: 2 person hand held assist Transfers: Sit to/from Stand;Stand Pivot Transfers Sit to Stand: +2 physical assistance;Max assist Stand pivot transfers: Max assist;+2 physical assistance       General transfer comment: today is needing  max a x 2, attempted several times to get up from recliner and was unsucessful  Ambulation/Gait             General Gait Details: unable to take any steps today   Stairs             Wheelchair Mobility    Modified Rankin (Stroke Patients Only)       Balance Overall balance assessment: Needs assistance Sitting-balance support: Feet supported;Bilateral upper extremity supported Sitting balance-Leahy Scale: Fair     Standing balance support: During functional activity;Bilateral upper extremity supported Standing balance-Leahy Scale: Zero                              Cognition Arousal/Alertness: Awake/alert Behavior During Therapy: Flat affect Overall Cognitive Status: No family/caregiver present to determine baseline cognitive functioning                                        Exercises      General Comments        Pertinent Vitals/Pain Pain Assessment: No/denies pain    Home Living                      Prior Function            PT Goals (current goals can now be found in the care plan section) Acute Rehab  PT Goals Patient Stated Goal: refusing to eat, refusing meds  PT Goal Formulation: Patient unable to participate in goal setting Time For Goal Achievement: 05/13/19 Potential to Achieve Goals: Fair Progress towards PT goals: Not progressing toward goals - comment    Frequency    Min 2X/week      PT Plan Current plan remains appropriate    Co-evaluation              AM-PAC PT "6 Clicks" Mobility   Outcome Measure  Help needed turning from your back to your side while in a flat bed without using bedrails?: None Help needed moving from lying on your back to sitting on the side of a flat bed without using bedrails?: A Lot Help needed moving to and from a bed to a chair (including a wheelchair)?: Total Help needed standing up from a chair using your arms (e.g., wheelchair or bedside chair)?:  Total Help needed to walk in hospital room?: Total Help needed climbing 3-5 steps with a railing? : Total 6 Click Score: 10    End of Session Equipment Utilized During Treatment: Gait belt Activity Tolerance: Treatment limited secondary to agitation Patient left: in bed;with call bell/phone within reach Nurse Communication: Other (comment)(nurse in room with therapist during tx) PT Visit Diagnosis: Muscle weakness (generalized) (M62.81);Unsteadiness on feet (R26.81);Difficulty in walking, not elsewhere classified (R26.2)     Time: 9450-3888 PT Time Calculation (min) (ACUTE ONLY): 16 min  Charges:  $Therapeutic Activity: 8-22 mins                     Horald Chestnut, PT    Delford Field 05/06/2019, 3:58 PM

## 2019-05-07 LAB — COMPREHENSIVE METABOLIC PANEL
ALT: 40 U/L (ref 0–44)
AST: 77 U/L — ABNORMAL HIGH (ref 15–41)
Albumin: 2.5 g/dL — ABNORMAL LOW (ref 3.5–5.0)
Alkaline Phosphatase: 218 U/L — ABNORMAL HIGH (ref 38–126)
Anion gap: 11 (ref 5–15)
BUN: 28 mg/dL — ABNORMAL HIGH (ref 8–23)
CO2: 27 mmol/L (ref 22–32)
Calcium: 9.1 mg/dL (ref 8.9–10.3)
Chloride: 106 mmol/L (ref 98–111)
Creatinine, Ser: 1.52 mg/dL — ABNORMAL HIGH (ref 0.61–1.24)
GFR calc Af Amer: 46 mL/min — ABNORMAL LOW (ref >60)
GFR calc non Af Amer: 40 mL/min — ABNORMAL LOW (ref >60)
Glucose, Bld: 98 mg/dL (ref 70–99)
Potassium: 3.2 mmol/L — ABNORMAL LOW (ref 3.5–5.1)
Sodium: 144 mmol/L (ref 135–145)
Total Bilirubin: 0.6 mg/dL (ref 0.3–1.2)
Total Protein: 7.7 g/dL (ref 6.5–8.1)

## 2019-05-07 LAB — CBC
HCT: 35.8 % — ABNORMAL LOW (ref 39.0–52.0)
Hemoglobin: 11.2 g/dL — ABNORMAL LOW (ref 13.0–17.0)
MCH: 28.6 pg (ref 26.0–34.0)
MCHC: 31.3 g/dL (ref 30.0–36.0)
MCV: 91.3 fL (ref 80.0–100.0)
Platelets: 367 10*3/uL (ref 150–400)
RBC: 3.92 MIL/uL — ABNORMAL LOW (ref 4.22–5.81)
RDW: 14.3 % (ref 11.5–15.5)
WBC: 10.7 10*3/uL — ABNORMAL HIGH (ref 4.0–10.5)
nRBC: 0 % (ref 0.0–0.2)

## 2019-05-07 LAB — PROTIME-INR
INR: 3.1 — ABNORMAL HIGH (ref 0.8–1.2)
Prothrombin Time: 31.4 seconds — ABNORMAL HIGH (ref 11.4–15.2)

## 2019-05-07 LAB — MAGNESIUM: Magnesium: 2.2 mg/dL (ref 1.7–2.4)

## 2019-05-07 MED ORDER — POTASSIUM CHLORIDE 10 MEQ/100ML IV SOLN
10.0000 meq | INTRAVENOUS | Status: AC
  Administered 2019-05-07 (×4): 10 meq via INTRAVENOUS
  Filled 2019-05-07: qty 100

## 2019-05-07 MED ORDER — WARFARIN 0.5 MG HALF TABLET
0.5000 mg | ORAL_TABLET | Freq: Once | ORAL | Status: AC
  Administered 2019-05-07: 0.5 mg via ORAL
  Filled 2019-05-07: qty 1

## 2019-05-07 MED ORDER — POTASSIUM CHLORIDE 20 MEQ/15ML (10%) PO SOLN
40.0000 meq | Freq: Once | ORAL | Status: DC
  Filled 2019-05-07: qty 30

## 2019-05-07 NOTE — Progress Notes (Addendum)
Patient requested to have his friend Lona Kettle called with daily update. Patient states that if "something bad happens" call Rolanda Jay. Asked patient if he would rather have Remo Lipps called for daily update. Patient states "No, call Bit Ross." [place] for update. Explained condition and plan of care.   11:51 Paged Dr [place] Pink regarding patient continued refusal of PO medications. Patient is not willing to take oral potassium. Patient is agreeable to IV potassium replacement. Patient refuses to eat other than small bites. Patient believes he can drive to the restaurant down the street from his house and eat better. Explained to patient that it was not safe for him to ambulate alone due to weakness and potential fall risk. Explained to patient again the importance of potassium replacement. Patient agreeable with some parts of care. Patient also refuses COVID swab again today.   14:18 Patient's cousin Remo Lipps called and stated that his friend found POA paperwork for Mr Bushey in his Rolanda Jay) [place] in [place] and had it emailed to him. Asked if he could fax this information to the nurse's station. Mr Marty Heck stated no that he was on a boat in [place]. Will attempt to get information so that POA paperwork can be sent and placed on patient's chart.  15:38 Called patient's cousin Remo Lipps with contact information for social worker to send over [place] forms. Remo Lipps stated that he was trying to sell his boat in [place] and didn't know about his internet connection. Remo Lipps stated the hospital could make a new one and place his and Jack's name on it or call him Monday to have it sent. Updated Shelton Silvas with social work.

## 2019-05-07 NOTE — Progress Notes (Signed)
PROGRESS NOTE  Marcus Quinn OVF:[id number] DOB: 08-Jul-1926 DOA: 04/27/2019  PCP: Doreene Eland, MD  Brief History/Interval Summary: 85 year old male past medical history of chronic atrial fibrillation, aortic stenosis(spTAVR),dyslipidemia, coronary artery disease.Recently diagnosed with COVID-09 February 2019,apparently he has been at a skilled nursing facility and then discharged home. At his home he lives alone, he experienced a rapid decline in his physical functional capacity associated with dyspnea and lower extremity edema. CT chest with bilateral pleural effusions,more on theleft. Abnormal appearance of the gallbladder.Possible acute cholecystitis. Patient was admitted to the hospital with working diagnosisofright heart failure,acute on chronic, in the setting of positive COVID-19 PCR.  Reason for Visit: Acute on chronic diastolic CHF.  Consultants: None  Procedures: None  Antibiotics: Anti-infectives (From admission, onward)   None      Subjective/Interval History: Patient seems to be more awake and alert this morning.  States that his appetite is improved.  He states that he wants to go home.  Denies any shortness of breath or chest pain.  He says his heart rate is usually fast.    Assessment/Plan:  Acute on chronic diastolic CHF Patient had bilateral pleural effusion on CT scan.  He was diuresed with Lasix.  He improved.  Due to poor oral intake Lasix was stopped.  Continue to monitor volume status.  Beta-blocker being continued.  Leukocytosis most likely due to megestrol.  COVID-19 infection Initially positive in August.  Also noted to be positive again prior to this admission.  Respiratory status is stable.  Repeat COVID-19 test was ordered to assist with placement however patient has adamantly refused.  He continues to refuse retesting.  Poor oral intake Oral intake seems to have improved slightly in the last 24 hours.  Continue with megestrol.  Reason  for this could be COVID-19.  He does have cholelithiasis but appears to be asymptomatic from that.  Continue to monitor.  Continue to encourage oral intake.    Chronic atrial fibrillation/with mild RVR Noted to be tachycardic this morning and examination.  We will check an EKG.  Continue metoprolol.  He is on warfarin for anticoagulation.  Thyroid function tests were normal in October.  See care everywhere.  Chronic kidney disease stage IIIa/hypokalemia Noted to be slightly higher today compared to yesterday.  Most likely this is all due to poor oral intake.  Potassium continues to be lower.  Magnesium 2.2.  Replace potassium.    Dyslipidemia Continue statin.  History of dementia Appears to be mild.    Cholelithiasis Ultrasound showed gallstones with sludge and wall thickening.  Concern for chronic cholecystitis.  However patient asymptomatic.  Abdomen remains benign  Family communication I was able to get a hold of Mr. Jillyn Ledger who is patient's cousin who lives in [place].  Patient has another contact person who is Hall Busing who lives in [place] who is the patient's cousin's husband.  Jeannett Senior does not know who would be the decision maker if the patient was not able to decide for himself.  He thinks that he and Ree Kida are the co-executors of the patient's estate but he is not sure.  He does not know if the patient has made any power of attorney documents. He will reach out to [place] to determine this.  I also spoke to the patient who does not know if he has power of attorney documentation.    DVT Prophylaxis: On warfarin PUD Prophylaxis: On Protonix Code Status: DNR Family Communication: I was able to reach out  to Dutch Quint who is the patient's cousin who lives in [place]. Disposition Plan: Skilled nursing facility has been recommended.  Patient refuses retesting.   Medications:  Scheduled: . ARIPiprazole  10 mg Oral QHS  . aspirin EC  81 mg Oral Daily  .  chlorpheniramine-HYDROcodone  5 mL Oral Q12H  . feeding supplement (ENSURE ENLIVE)  237 mL Oral BID BM  . megestrol  400 mg Oral Daily  . metoprolol succinate  75 mg Oral Daily  . pantoprazole  40 mg Oral Daily  . rosuvastatin  20 mg Oral q1800  . vitamin C  500 mg Oral Daily  . Warfarin - Pharmacist Dosing Inpatient   Does not apply q1800  . zinc sulfate  220 mg Oral Daily   Continuous:  UTM:[id number] **OR** acetaminophen, guaiFENesin-dextromethorphan, hydrALAZINE, ondansetron **OR** ondansetron (ZOFRAN) IV   Objective:  Vital Signs  Vitals:   05/06/19 1747 05/06/19 1916 05/07/19 0353 05/07/19 0747  BP: 131/88 (!) 124/96 137/66 131/80  Pulse: 68 69 (!) 115 (!) 110  Resp:    18  Temp: 98.5 F (36.9 C) 98.2 F (36.8 C) 99.1 F (37.3 C) 97.8 F (36.6 C)  TempSrc: Oral Oral Oral Oral  SpO2: 100% 98% 96%   Weight:      Height:        Intake/Output Summary (Last 24 hours) at 05/07/2019 1008 Last data filed at 05/07/2019 0600 Gross per 24 hour  Intake 1370 ml  Output -  Net 1370 ml   Filed Weights   04/28/19 1815  Weight: 77.1 kg    General appearance: More awake and alert this morning.  In no distress. Resp: Clear to auscultation bilaterally.  Normal effort Cardio: S1-S2 is normal regular.  No S3-S4.  No rubs murmurs or bruit GI: Abdomen is soft.  Nontender nondistended.  Bowel sounds are present normal.  No masses organomegaly Extremities: No edema.  Moving his extremities Neurologic: No obvious focal neurological deficits.  Occasionally distracted but able to answer most of my questions appropriately.   Lab Results:  Data Reviewed: I have personally reviewed following labs and imaging studies  CBC: Recent Labs  Lab 05/05/19 0500 05/06/19 0128 05/07/19 0349  WBC 16.7* 12.0* 10.7*  HGB 11.4* 10.5* 11.2*  HCT 36.2* 33.1* 35.8*  MCV 91.6 91.4 91.3  PLT 383 357 275    Basic Metabolic Panel: Recent Labs  Lab 05/02/19 0430 05/04/19 0225 05/05/19  0500 05/06/19 0128 05/07/19 0349  NA 142 142 143 145 144  K 3.5 3.2* 3.1* 3.1* 3.2*  CL 105 101 103 107 106  CO2 22 26 28 26 27   GLUCOSE 85 78 132* 89 98  BUN 18 25* 30* 30* 28*  CREATININE 1.32* 1.39* 1.46* 1.44* 1.52*  CALCIUM 8.8* 9.1 9.0 8.9 9.1  MG  --   --  2.2  --  2.2    GFR: Estimated Creatinine Clearance: 35.2 mL/min (A) (by C-G formula based on SCr of 1.52 mg/dL (H)).  Liver Function Tests: Recent Labs  Lab 05/05/19 0500 05/06/19 0128 05/07/19 0349  AST 42* 55* 77*  ALT 27 30 40  ALKPHOS 216* 204* 218*  BILITOT 1.0 0.7 0.6  PROT 8.1 7.3 7.7  ALBUMIN 2.6* 2.3* 2.5*    Coagulation Profile: Recent Labs  Lab 05/03/19 0205 05/04/19 0225 05/05/19 0500 05/06/19 0128 05/07/19 0349  INR 3.9* 3.3* 2.9* 3.0* 3.1*    CBG: No results for input(s): GLUCAP in the last 168 hours.  Radiology Studies: No results  found.     LOS: 10 days   Shirlena Brinegar [employer]Krishnan  [facility] Pager on [email or web address]  05/07/2019, 10:08 AM

## 2019-05-07 NOTE — Progress Notes (Signed)
ANTICOAGULATION CONSULT NOTE - Follow Up Consult  Pharmacy Consult for Warfarin Indication: atrial fibrillation  No Known Allergies  Patient Measurements: Height: 6' (182.9 cm) Weight: 170 lb (77.1 kg) IBW/kg (Calculated) : 77.6  Vital Signs: Temp: 97.8 F (36.6 C) (11/14 0747) Temp Source: Oral (11/14 0747) BP: 131/80 (11/14 0747) Pulse Rate: 110 (11/14 0747)  Labs: Recent Labs    05/05/19 0500 05/06/19 0128 05/07/19 0349  HGB 11.4* 10.5* 11.2*  HCT 36.2* 33.1* 35.8*  PLT 383 357 367  LABPROT 30.1* 30.4* 31.4*  INR 2.9* 3.0* 3.1*  CREATININE 1.46* 1.44* 1.52*    Estimated Creatinine Clearance: 35.2 mL/min (A) (by C-G formula based on SCr of 1.52 mg/dL (H)).   Medications:  Scheduled:  . ARIPiprazole  10 mg Oral QHS  . aspirin EC  81 mg Oral Daily  . chlorpheniramine-HYDROcodone  5 mL Oral Q12H  . feeding supplement (ENSURE ENLIVE)  237 mL Oral BID BM  . megestrol  400 mg Oral Daily  . metoprolol succinate  75 mg Oral Daily  . pantoprazole  40 mg Oral Daily  . rosuvastatin  20 mg Oral q1800  . vitamin C  500 mg Oral Daily  . Warfarin - Pharmacist Dosing Inpatient   Does not apply q1800  . zinc sulfate  220 mg Oral Daily   Infusions:    Assessment: 89 yoM admitted to [facility] from [facility] with +COVID-19.  PMH is significant for chronic Afib on warfarin, dyslipidemia, CAD, andaortic stenosis s/p TAVR.  Pharmacy has been consulted to continue warfarin dosing.  PTA warfarin dose is 5mg  daily.  Today, 05/07/2019  INR is slightly surpa-therapeutic despite low dose warfarin x 2 days and holding multiple doses (dose held from 11/7-11/11)  H/H low stable, Plt wnl   Of note, megace can potentially counteract warfarin effects   Patient has minimal oral intake per charting consuming 0-25% of meals.  Goal of Therapy:  INR 2-3 Monitor platelets by anticoagulation protocol: Yes   Plan:   Warfarin 0.5 mg x 1 dose   Daily PT/INR.  Monitor for signs  and symptoms of bleeding.  Albertina Parr, PharmD., BCPS Clinical Pharmacist Clinical phone for 05/07/19 until 5pm: [phone]

## 2019-05-07 NOTE — Plan of Care (Signed)
Patient still refusing covid swab. Patient incontinent of urine and also refusing condom cath. Encouraged oral intake.  Problem: Clinical Measurements: Goal: Will remain free from infection Outcome: Progressing Goal: Diagnostic test results will improve Outcome: Progressing Goal: Respiratory complications will improve Outcome: Progressing Goal: Cardiovascular complication will be avoided Outcome: Progressing   Problem: Activity: Goal: Risk for activity intolerance will decrease Outcome: Progressing   Problem: Elimination: Goal: Will not experience complications related to bowel motility Outcome: Progressing Goal: Will not experience complications related to urinary retention Outcome: Progressing   Problem: Pain Managment: Goal: General experience of comfort will improve Outcome: Progressing   Problem: Safety: Goal: Ability to remain free from injury will improve Outcome: Progressing   Problem: Skin Integrity: Goal: Risk for impaired skin integrity will decrease Outcome: Progressing

## 2019-05-08 LAB — CBC
HCT: 35.7 % — ABNORMAL LOW (ref 39.0–52.0)
Hemoglobin: 11.1 g/dL — ABNORMAL LOW (ref 13.0–17.0)
MCH: 28.8 pg (ref 26.0–34.0)
MCHC: 31.1 g/dL (ref 30.0–36.0)
MCV: 92.5 fL (ref 80.0–100.0)
Platelets: 347 10*3/uL (ref 150–400)
RBC: 3.86 MIL/uL — ABNORMAL LOW (ref 4.22–5.81)
RDW: 14.2 % (ref 11.5–15.5)
WBC: 9.9 10*3/uL (ref 4.0–10.5)
nRBC: 0 % (ref 0.0–0.2)

## 2019-05-08 LAB — BASIC METABOLIC PANEL
Anion gap: 11 (ref 5–15)
BUN: 20 mg/dL (ref 8–23)
CO2: 26 mmol/L (ref 22–32)
Calcium: 9.2 mg/dL (ref 8.9–10.3)
Chloride: 106 mmol/L (ref 98–111)
Creatinine, Ser: 1.37 mg/dL — ABNORMAL HIGH (ref 0.61–1.24)
GFR calc Af Amer: 52 mL/min — ABNORMAL LOW (ref >60)
GFR calc non Af Amer: 45 mL/min — ABNORMAL LOW (ref >60)
Glucose, Bld: 87 mg/dL (ref 70–99)
Potassium: 3.9 mmol/L (ref 3.5–5.1)
Sodium: 143 mmol/L (ref 135–145)

## 2019-05-08 LAB — PROTIME-INR
INR: 2.6 — ABNORMAL HIGH (ref 0.8–1.2)
Prothrombin Time: 27.1 seconds — ABNORMAL HIGH (ref 11.4–15.2)

## 2019-05-08 MED ORDER — WARFARIN SODIUM 1 MG PO TABS
1.0000 mg | ORAL_TABLET | Freq: Once | ORAL | Status: AC
  Administered 2019-05-08: 1 mg via ORAL
  Filled 2019-05-08: qty 1

## 2019-05-08 NOTE — Plan of Care (Signed)
Discussed with patient plan of care for the evening, pain management and encouraged him to take his night time medications with some teach back displayed.  Attempted to ask patient to wear condom cath for his urinary incontinence and explained about his skin possible breaking down with no avail. Was unable to update his family due to giving direct care.

## 2019-05-08 NOTE — Plan of Care (Signed)
Discussed plan of care for the evening, pain management and appetite has increased wants vanilla ice cream for a snack. Updated family as well.

## 2019-05-08 NOTE — Progress Notes (Signed)
PROGRESS NOTE  Marcus Quinn QIO:[id number] DOB: 11-Mar-1927 DOA: 04/27/2019  PCP: Marcus Eland, MD  Brief History/Interval Summary: 85 year old male past medical history of chronic atrial fibrillation, aortic stenosis(spTAVR),dyslipidemia, coronary artery disease.Recently diagnosed with COVID-09 February 2019,apparently he has been at a skilled nursing facility and then discharged home. At his home he lives alone, he experienced a rapid decline in his physical functional capacity associated with dyspnea and lower extremity edema. CT chest with bilateral pleural effusions,more on theleft. Abnormal appearance of the gallbladder.Possible acute cholecystitis. Patient was admitted to the hospital with working diagnosisofright heart failure,acute on chronic, in the setting of positive COVID-19 PCR.  Reason for Visit: Acute on chronic diastolic CHF.  Consultants: None  Procedures: None  Antibiotics: Anti-infectives (From admission, onward)   None      Subjective/Interval History: Nursing staff reports that patient has had very poor oral intake.  Long discussion with patient today.  He knows that he is in the hospital.  However he is unable to tell me why he is not eating or drinking.  He does understand that he is weak but he does not agree that this will be a problem when he goes home.  He is adamant that he does not want to go to a skilled nursing facility but is not able to give me a reason why.  When I told him that by going to skilled nursing facility for rehab he will get stronger he does not seem to understand that part.  He was told that he is on blood thinners and if he does have a fall at home in an unsupervised setting this could be catastrophic.  He does not seem to understand that either.  He states that when he goes home he will eat better but unable to tell me how he plans to eat better at home.  He does live by himself.  Patient denies any shortness of breath.    Assessment/Plan:  Acute on chronic diastolic CHF Patient had bilateral pleural effusion on CT scan.  He was diuresed with Lasix.  He improved.  Due to poor oral intake Lasix was stopped.  Volume status seems to be stable.  Continue to monitor.    COVID-19 infection Initially positive in August.  Also noted to be positive again prior to this admission. Repeat COVID-19 test was ordered to assist with placement however patient has adamantly refused.  He continues to refuse retesting.  Respiratory status is stable.  Poor oral intake Patient with very poor oral intake here in the hospital.  He was started on megestrol with no significant improvement.  Patient has declined feeding tubes.  He seems to be under the impression that he will eat better when he goes home although he lives by himself.  He does not have a plan as to who will cook for him and who will get his meals.  Even though he is noted to have cholelithiasis, that seems to be asymptomatic at this time.  And probably not contributing to his poor appetite.    Chronic atrial fibrillation/with mild RVR Heart rate noted to be better today.  Continue metoprolol.  He is on warfarin for anticoagulation.  Thyroid function tests were normal in October on care everywhere.    Chronic kidney disease stage IIIa/hypokalemia Renal function is stable.  Potassium is better today.  Magnesium was 2.2 yesterday.     Dyslipidemia Continue statin.  History of dementia Appears to be mild.    Cholelithiasis Ultrasound showed  gallstones with sludge and wall thickening.  Concern for chronic cholecystitis.  However patient asymptomatic.  Abdomen remains benign  Family communication I was able to get a hold of Marcus Quinn who is patient's cousin who lives in [place].  Patient has another contact person who is Marcus Quinn who lives in [place] who is the patient's cousin's husband.  Marcus Quinn was apparently able to find the power of attorney  documents.  I will call him and Marcus Quinn today to determine further course of action.    Goals of care Patient continues to have very poor oral intake.  He appears to be declining.  I have discussed with nursing staff and have requested them to try and sit the patient up in the chair.  Patient is adamant about going home and not to a skilled nursing facility.  If he does not start eating soon his prognosis will become poor.  I did a capacity evaluation on him today.  Patient appears to lack an understanding of the nature of his condition.  He does not have an understanding of why he needs rehab to get stronger.  He is unable to rationally manipulate information to make an informed decision most likely due to his cognitive deficits.  At this time I feel that the patient does not have capacity to make an informed decision regarding placement.  DVT Prophylaxis: On warfarin PUD Prophylaxis: On Protonix Code Status: DNR Family Communication: Will try to reach out to Marcus Quinn and Marcus Quinn today. Disposition Plan: Skilled nursing facility has been recommended.  Patient refuses retesting.   Medications:  Scheduled: . ARIPiprazole  10 mg Oral QHS  . aspirin EC  81 mg Oral Daily  . chlorpheniramine-HYDROcodone  5 mL Oral Q12H  . feeding supplement (ENSURE ENLIVE)  237 mL Oral BID BM  . megestrol  400 mg Oral Daily  . metoprolol succinate  75 mg Oral Daily  . pantoprazole  40 mg Oral Daily  . potassium chloride  40 mEq Per Tube Once  . rosuvastatin  20 mg Oral q1800  . vitamin C  500 mg Oral Daily  . Warfarin - Pharmacist Dosing Inpatient   Does not apply q1800  . zinc sulfate  220 mg Oral Daily   Continuous:  EXH:[id number] **OR** acetaminophen, guaiFENesin-dextromethorphan, hydrALAZINE, ondansetron **OR** ondansetron (ZOFRAN) IV   Objective:  Vital Signs  Vitals:   05/08/19 0424 05/08/19 0500 05/08/19 0600 05/08/19 0745  BP: 137/85   117/73  Pulse: 90 (!) 102 97 98  Resp:      Temp: 98.4 F  (36.9 C)   (!) 97.5 F (36.4 C)  TempSrc: Oral   Axillary  SpO2: 96% 100% 98% 99%  Weight:      Height:        Intake/Output Summary (Last 24 hours) at 05/08/2019 1044 Last data filed at 05/08/2019 0600 Gross per 24 hour  Intake 1060.1 ml  Output -  Net 1060.1 ml   Filed Weights   04/28/19 1815  Weight: 77.1 kg    General appearance: He is awake alert.  Mildly distracted.  In no distress. Resp: Clear to auscultation bilaterally.  Normal effort Cardio: S1-S2 is normal regular.  No S3-S4.  No rubs murmurs or bruit GI: Abdomen is soft.  Nontender nondistended.  Bowel sounds are present normal.  No masses organomegaly Extremities: No edema.  Full range of motion of lower extremities. Neurologic: Awake alert.  Mildly distracted.  Oriented to place year person.  No obvious focal  neurological deficits.   Lab Results:  Data Reviewed: I have personally reviewed following labs and imaging studies  CBC: Recent Labs  Lab 05/05/19 0500 05/06/19 0128 05/07/19 0349 05/08/19 0425  WBC 16.7* 12.0* 10.7* 9.9  HGB 11.4* 10.5* 11.2* 11.1*  HCT 36.2* 33.1* 35.8* 35.7*  MCV 91.6 91.4 91.3 92.5  PLT 383 357 367 347    Basic Metabolic Panel: Recent Labs  Lab 05/04/19 0225 05/05/19 0500 05/06/19 0128 05/07/19 0349 05/08/19 0425  NA 142 143 145 144 143  K 3.2* 3.1* 3.1* 3.2* 3.9  CL 101 103 107 106 106  CO2 26 28 26 27 26   GLUCOSE 78 132* 89 98 87  BUN 25* 30* 30* 28* 20  CREATININE 1.39* 1.46* 1.44* 1.52* 1.37*  CALCIUM 9.1 9.0 8.9 9.1 9.2  MG  --  2.2  --  2.2  --     GFR: Estimated Creatinine Clearance: 39.1 mL/min (A) (by C-G formula based on SCr of 1.37 mg/dL (H)).  Liver Function Tests: Recent Labs  Lab 05/05/19 0500 05/06/19 0128 05/07/19 0349  AST 42* 55* 77*  ALT 27 30 40  ALKPHOS 216* 204* 218*  BILITOT 1.0 0.7 0.6  PROT 8.1 7.3 7.7  ALBUMIN 2.6* 2.3* 2.5*    Coagulation Profile: Recent Labs  Lab 05/04/19 0225 05/05/19 0500 05/06/19 0128  05/07/19 0349 05/08/19 0425  INR 3.3* 2.9* 3.0* 3.1* 2.6*    CBG: No results for input(s): GLUCAP in the last 168 hours.  Radiology Studies: No results found.     LOS: 11 days   Lakyn Alsteen [employer] LockerKrishnan  [facility] Pager on [email or web address]  05/08/2019, 10:44 AM

## 2019-05-08 NOTE — Progress Notes (Signed)
ANTICOAGULATION CONSULT NOTE - Follow Up Consult  Pharmacy Consult for Warfarin Indication: atrial fibrillation  No Known Allergies  Patient Measurements: Height: 6' (182.9 cm) Weight: 170 lb (77.1 kg) IBW/kg (Calculated) : 77.6  Vital Signs: Temp: 97.5 F (36.4 C) (11/15 0745) Temp Source: Axillary (11/15 0745) BP: 117/73 (11/15 0745) Pulse Rate: 98 (11/15 0745)  Labs: Recent Labs    05/06/19 0128 05/07/19 0349 05/08/19 0425  HGB 10.5* 11.2* 11.1*  HCT 33.1* 35.8* 35.7*  PLT 357 367 347  LABPROT 30.4* 31.4* 27.1*  INR 3.0* 3.1* 2.6*  CREATININE 1.44* 1.52* 1.37*    Estimated Creatinine Clearance: 39.1 mL/min (A) (by C-G formula based on SCr of 1.37 mg/dL (H)).   Medications:  Scheduled:  . ARIPiprazole  10 mg Oral QHS  . aspirin EC  81 mg Oral Daily  . chlorpheniramine-HYDROcodone  5 mL Oral Q12H  . feeding supplement (ENSURE ENLIVE)  237 mL Oral BID BM  . megestrol  400 mg Oral Daily  . metoprolol succinate  75 mg Oral Daily  . pantoprazole  40 mg Oral Daily  . potassium chloride  40 mEq Per Tube Once  . rosuvastatin  20 mg Oral q1800  . vitamin C  500 mg Oral Daily  . Warfarin - Pharmacist Dosing Inpatient   Does not apply q1800  . zinc sulfate  220 mg Oral Daily   Infusions:    Assessment: 89 yoM admitted to [facility] from [facility] with +COVID-19.  PMH is significant for chronic Afib on warfarin, dyslipidemia, CAD, andaortic stenosis s/p TAVR.  Pharmacy has been consulted to continue warfarin dosing.  PTA warfarin dose is 5mg  daily.  Today, 05/08/2019  INR is therapeutic at 2.6 today (dose held from 11/7-11/11)  H/H low stable, Plt wnl   Of note, megace can potentially counteract warfarin effects   Patient has minimal oral intake per charting consuming 0-25% of meals.  Goal of Therapy:  INR 2-3 Monitor platelets by anticoagulation protocol: Yes   Plan:   Warfarin 1 mg x 1 dose   Daily PT/INR.  Monitor for signs and symptoms of  bleeding.  Albertina Parr, PharmD., BCPS Clinical Pharmacist Clinical phone for 05/08/19 until 5pm: [phone]

## 2019-05-08 NOTE — Progress Notes (Signed)
Spoke to patient's friend [name]. Updated on plan of care and condition. All questions answered.

## 2019-05-09 LAB — PROTIME-INR
INR: 2.5 — ABNORMAL HIGH (ref 0.8–1.2)
Prothrombin Time: 26.8 seconds — ABNORMAL HIGH (ref 11.4–15.2)

## 2019-05-09 LAB — BASIC METABOLIC PANEL
Anion gap: 11 (ref 5–15)
BUN: 18 mg/dL (ref 8–23)
CO2: 27 mmol/L (ref 22–32)
Calcium: 9.2 mg/dL (ref 8.9–10.3)
Chloride: 106 mmol/L (ref 98–111)
Creatinine, Ser: 1.44 mg/dL — ABNORMAL HIGH (ref 0.61–1.24)
GFR calc Af Amer: 49 mL/min — ABNORMAL LOW (ref >60)
GFR calc non Af Amer: 42 mL/min — ABNORMAL LOW (ref >60)
Glucose, Bld: 91 mg/dL (ref 70–99)
Potassium: 4 mmol/L (ref 3.5–5.1)
Sodium: 144 mmol/L (ref 135–145)

## 2019-05-09 LAB — CBC
HCT: 36.6 % — ABNORMAL LOW (ref 39.0–52.0)
Hemoglobin: 11.3 g/dL — ABNORMAL LOW (ref 13.0–17.0)
MCH: 28.5 pg (ref 26.0–34.0)
MCHC: 30.9 g/dL (ref 30.0–36.0)
MCV: 92.4 fL (ref 80.0–100.0)
Platelets: 335 10*3/uL (ref 150–400)
RBC: 3.96 MIL/uL — ABNORMAL LOW (ref 4.22–5.81)
RDW: 14.1 % (ref 11.5–15.5)
WBC: 9.5 10*3/uL (ref 4.0–10.5)
nRBC: 0 % (ref 0.0–0.2)

## 2019-05-09 MED ORDER — CAMPHOR-PHENOL 10.8-4.7 % EX LIQD: Freq: Every day | CUTANEOUS | Status: DC | PRN

## 2019-05-09 MED ORDER — BENZOCAINE 10 % MT GEL
Freq: Every day | OROMUCOSAL | Status: DC | PRN
  Filled 2019-05-09: qty 9

## 2019-05-09 MED ORDER — WARFARIN SODIUM 1 MG PO TABS
1.0000 mg | ORAL_TABLET | Freq: Once | ORAL | Status: AC
  Administered 2019-05-09: 1 mg via ORAL
  Filled 2019-05-09: qty 1

## 2019-05-09 NOTE — Progress Notes (Signed)
PROGRESS NOTE  Marcus Quinn [id number] DOB: Dec 30, 1926 DOA: 04/27/2019  PCP: Doreene Elandhomas, Millard B, MD  Brief History/Interval Summary: 85 year old male past medical history of chronic atrial fibrillation, aortic stenosis(spTAVR),dyslipidemia, coronary artery disease.Recently diagnosed with COVID-09 February 2019,apparently he has been at a skilled nursing facility and then discharged home. At his home he lives alone, he experienced a rapid decline in his physical functional capacity associated with dyspnea and lower extremity edema. CT chest with bilateral pleural effusions,more on theleft. Abnormal appearance of the gallbladder.Possible acute cholecystitis. Patient was admitted to the hospital with working diagnosisofright heart failure,acute on chronic, in the setting of positive COVID-19 PCR.  Reason for Visit: Acute on chronic diastolic CHF.  Consultants: None  Procedures: None  Antibiotics: Anti-infectives (From admission, onward)   None      Subjective/Interval History: Patient seems confused distracted.  Continues to insist on going home but cannot tell me how he plans to get there and what he plans to do regarding his meals.     Assessment/Plan:  Acute on chronic diastolic CHF Patient had bilateral pleural effusion on CT scan.  He was diuresed with Lasix.  Volume status is now stable.  He is off of diuretics due to poor oral intake.  COVID-19 infection Initially positive in August.  Also noted to be positive again prior to this admission. Repeat COVID-19 test was ordered to assist with placement however patient has adamantly refused.  He continues to refuse retesting.  Respiratory status is stable.  Poor oral intake Patient with very poor oral intake here in the hospital.  Patient was started on megestrol.  Oral intake has not improved much but may have to give him some more time.  Patient has declined feeding tubes.  He seems to be under the impression that he  will eat better when he goes home. But he lives by himself and is not able to tell me what his plan is when he does get home. Even though he is noted to have cholelithiasis, that seems to be asymptomatic at this time.  And probably not contributing to his poor appetite.    Chronic atrial fibrillation/with mild RVR Heart rate noted to be better today.  Continue metoprolol.  He is on warfarin for anticoagulation.  Thyroid function tests were normal in October on care everywhere.  INR is therapeutic.  Pharmacy is managing.  Chronic kidney disease stage IIIa/hypokalemia Renal function is stable.  Potassium is improved.  Dyslipidemia Continue statin.  History of dementia Stable  Cholelithiasis Ultrasound showed gallstones with sludge and wall thickening.  Concern for chronic cholecystitis.  However patient asymptomatic.  Abdomen remains benign  Family communication Patient's power of attorney is [name] who lives in [place].  [name] is the husband of patient's cousin.  Patient's has another cousin called Jill PolingStephen Starling who lives in [place].    Assessment of capacity Please see note from 11/15 for capacity evaluation.  Based on my evaluation I do not think that this patient has capacity to make an informed decision regarding his living situation and placement.  Goals of care Patient continues to have poor oral intake.  Prognosis is guarded at this time.  At this time I would favor that he go to a skilled nursing facility and if he continues to have poor oral intake over the next several days then hospice be considered.  I have discussed these issues with Mr. [name] who is patient's power of attorney and he agrees.  DVT Prophylaxis:  On warfarin PUD Prophylaxis: On Protonix Code Status: DNR Family Communication: Discussed with Mr. Hall Busing who is patient's power of attorney Disposition Plan: Skilled nursing facility has been recommended.  Patient refuses retesting.    Medications:  Scheduled: . ARIPiprazole  10 mg Oral QHS  . aspirin EC  81 mg Oral Daily  . chlorpheniramine-HYDROcodone  5 mL Oral Q12H  . feeding supplement (ENSURE ENLIVE)  237 mL Oral BID BM  . megestrol  400 mg Oral Daily  . metoprolol succinate  75 mg Oral Daily  . pantoprazole  40 mg Oral Daily  . rosuvastatin  20 mg Oral q1800  . vitamin C  500 mg Oral Daily  . warfarin  1 mg Oral ONCE-1800  . Warfarin - Pharmacist Dosing Inpatient   Does not apply q1800  . zinc sulfate  220 mg Oral Daily   Continuous:  YHC:[id number] **OR** acetaminophen, guaiFENesin-dextromethorphan, hydrALAZINE, ondansetron **OR** ondansetron (ZOFRAN) IV   Objective:  Vital Signs  Vitals:   05/09/19 0515 05/09/19 0600 05/09/19 0610 05/09/19 0741  BP:    104/71  Pulse: 90 (!) 107 99 92  Resp:      Temp:    98.1 F (36.7 C)  TempSrc:    Axillary  SpO2: 99% 98% 98% 98%  Weight:      Height:        Intake/Output Summary (Last 24 hours) at 05/09/2019 1113 Last data filed at 05/09/2019 1026 Gross per 24 hour  Intake 670 ml  Output -  Net 670 ml   Filed Weights   04/28/19 1815  Weight: 77.1 kg    General appearance: Alert.  Distracted. Resp: Clear to auscultation bilaterally.  Normal effort Cardio: S1-S2 is normal regular.  No S3-S4.  No rubs murmurs or bruit GI: Abdomen is soft.  Nontender nondistended.  Bowel sounds are present normal.  No masses organomegaly Extremities: No edema.  Full range of motion of lower extremities. Neurologic: Distracted.  No obvious focal neurological deficits.   Lab Results:  Data Reviewed: I have personally reviewed following labs and imaging studies  CBC: Recent Labs  Lab 05/05/19 0500 05/06/19 0128 05/07/19 0349 05/08/19 0425 05/09/19 0305  WBC 16.7* 12.0* 10.7* 9.9 9.5  HGB 11.4* 10.5* 11.2* 11.1* 11.3*  HCT 36.2* 33.1* 35.8* 35.7* 36.6*  MCV 91.6 91.4 91.3 92.5 92.4  PLT 383 357 367 347 335    Basic Metabolic Panel: Recent Labs   Lab 05/05/19 0500 05/06/19 0128 05/07/19 0349 05/08/19 0425 05/09/19 0305  NA 143 145 144 143 144  K 3.1* 3.1* 3.2* 3.9 4.0  CL 103 107 106 106 106  CO2 28 26 27 26 27   GLUCOSE 132* 89 98 87 91  BUN 30* 30* 28* 20 18  CREATININE 1.46* 1.44* 1.52* 1.37* 1.44*  CALCIUM 9.0 8.9 9.1 9.2 9.2  MG 2.2  --  2.2  --   --     GFR: Estimated Creatinine Clearance: 37.2 mL/min (A) (by C-G formula based on SCr of 1.44 mg/dL (H)).  Liver Function Tests: Recent Labs  Lab 05/05/19 0500 05/06/19 0128 05/07/19 0349  AST 42* 55* 77*  ALT 27 30 40  ALKPHOS 216* 204* 218*  BILITOT 1.0 0.7 0.6  PROT 8.1 7.3 7.7  ALBUMIN 2.6* 2.3* 2.5*    Coagulation Profile: Recent Labs  Lab 05/05/19 0500 05/06/19 0128 05/07/19 0349 05/08/19 0425 05/09/19 0305  INR 2.9* 3.0* 3.1* 2.6* 2.5*    CBG: No results for input(s): GLUCAP in the  last 168 hours.  Radiology Studies: No results found.     LOS: 12 days   Sherl Yzaguirre [employer] on [email or web address]  05/09/2019, 11:13 AM

## 2019-05-09 NOTE — Progress Notes (Signed)
ANTICOAGULATION CONSULT NOTE - Follow Up Consult  Pharmacy Consult for Warfarin Indication: atrial fibrillation  No Known Allergies  Patient Measurements: Height: 6' (182.9 cm) Weight: 170 lb (77.1 kg) IBW/kg (Calculated) : 77.6  Vital Signs: Temp: 98.1 F (36.7 C) (11/16 0741) Temp Source: Axillary (11/16 0741) BP: 104/71 (11/16 0741) Pulse Rate: 92 (11/16 0741)  Labs: Recent Labs    05/07/19 0349 05/08/19 0425 05/09/19 0305  HGB 11.2* 11.1* 11.3*  HCT 35.8* 35.7* 36.6*  PLT 367 347 335  LABPROT 31.4* 27.1* 26.8*  INR 3.1* 2.6* 2.5*  CREATININE 1.52* 1.37* 1.44*    Estimated Creatinine Clearance: 37.2 mL/min (A) (by C-G formula based on SCr of 1.44 mg/dL (H)).   Medications:  Scheduled:  . ARIPiprazole  10 mg Oral QHS  . aspirin EC  81 mg Oral Daily  . chlorpheniramine-HYDROcodone  5 mL Oral Q12H  . feeding supplement (ENSURE ENLIVE)  237 mL Oral BID BM  . megestrol  400 mg Oral Daily  . metoprolol succinate  75 mg Oral Daily  . pantoprazole  40 mg Oral Daily  . rosuvastatin  20 mg Oral q1800  . vitamin C  500 mg Oral Daily  . Warfarin - Pharmacist Dosing Inpatient   Does not apply q1800  . zinc sulfate  220 mg Oral Daily   Infusions:    Assessment: 89 yoM on PTA warfarin 5mg  daily for Afib, resumed inpatient. Doses held from11/7-11/11 - likely due to zero oral intake. Have been using smaller doses past few days and appetite seems to be improving. INR therapeutic at 2.5.Hgb and plts stable.  Goal of Therapy:  INR 2-3 Monitor platelets by anticoagulation protocol: Yes   Plan:  Warfarin 1 mg PO today Monitor daily INR, CBC, s/s of bleed  Elenor Quinones, PharmD, BCPS, BCIDP Clinical Pharmacist 05/09/2019 10:30 AM

## 2019-05-09 NOTE — Progress Notes (Signed)
Attempted to call Remo Lipps to update no message left

## 2019-05-10 LAB — PROTIME-INR
INR: 2.4 — ABNORMAL HIGH (ref 0.8–1.2)
Prothrombin Time: 25.7 seconds — ABNORMAL HIGH (ref 11.4–15.2)

## 2019-05-10 MED ORDER — CAPSAICIN 0.025 % EX CREA
TOPICAL_CREAM | Freq: Two times a day (BID) | CUTANEOUS | Status: DC
  Administered 2019-05-10 – 2019-05-12 (×5): via TOPICAL
  Filled 2019-05-10: qty 60

## 2019-05-10 MED ORDER — WARFARIN SODIUM 1 MG PO TABS
1.0000 mg | ORAL_TABLET | Freq: Once | ORAL | Status: AC
  Administered 2019-05-10: 1 mg via ORAL
  Filled 2019-05-10: qty 1

## 2019-05-10 NOTE — Progress Notes (Signed)
ANTICOAGULATION CONSULT NOTE - Follow Up Consult  Pharmacy Consult for Warfarin Indication: atrial fibrillation  No Known Allergies  Patient Measurements: Height: 6' (182.9 cm) Weight: 170 lb (77.1 kg) IBW/kg (Calculated) : 77.6  Vital Signs: Temp: 98 F (36.7 C) (11/17 0738) Temp Source: Oral (11/17 0738) BP: 116/71 (11/17 0738) Pulse Rate: 98 (11/17 0738)  Labs: Recent Labs    05/08/19 0425 05/09/19 0305 05/10/19 0410  HGB 11.1* 11.3*  --   HCT 35.7* 36.6*  --   PLT 347 335  --   LABPROT 27.1* 26.8* 25.7*  INR 2.6* 2.5* 2.4*  CREATININE 1.37* 1.44*  --     Estimated Creatinine Clearance: 37.2 mL/min (A) (by C-G formula based on SCr of 1.44 mg/dL (H)).   Medications:  Scheduled:  . ARIPiprazole  10 mg Oral QHS  . aspirin EC  81 mg Oral Daily  . capsaicin   Topical BID  . chlorpheniramine-HYDROcodone  5 mL Oral Q12H  . feeding supplement (ENSURE ENLIVE)  237 mL Oral BID BM  . megestrol  400 mg Oral Daily  . metoprolol succinate  75 mg Oral Daily  . pantoprazole  40 mg Oral Daily  . rosuvastatin  20 mg Oral q1800  . vitamin C  500 mg Oral Daily  . Warfarin - Pharmacist Dosing Inpatient   Does not apply q1800  . zinc sulfate  220 mg Oral Daily   Infusions:    Assessment: 89 yoM on PTA warfarin 5mg  daily for Afib, resumed inpatient. Doses held from11/7-11/11 - likely due to zero oral intake. Have been using smaller doses past few days and appetite seems to be improving. INR therapeutic at 2.4.  Hgb and plts stable.  No bleeding or complications reported.  Goal of Therapy:  INR 2-3 Monitor platelets by anticoagulation protocol: Yes   Plan:  Warfarin 1 mg PO today Monitor daily INR, CBC, s/s of bleed  Gretta Arab PharmD, BCPS Clinical pharmacist phone 7am- 5pm: [phone] 05/10/2019 1:29 PM

## 2019-05-10 NOTE — Progress Notes (Signed)
PROGRESS NOTE  Nathaniel ManJohn Mallicoat ZOX:[id number] DOB: 1927/03/19 DOA: 04/27/2019  PCP: Doreene Elandhomas, Millard B, MD  Brief History/Interval Summary: 85 year old male past medical history of chronic atrial fibrillation, aortic stenosis(spTAVR),dyslipidemia, coronary artery disease.Recently diagnosed with COVID-09 February 2019,apparently he has been at a skilled nursing facility and then discharged home. At his home he lives alone, he experienced a rapid decline in his physical functional capacity associated with dyspnea and lower extremity edema. CT chest with bilateral pleural effusions,more on theleft. Abnormal appearance of the gallbladder.Possible acute cholecystitis. Patient was admitted to the hospital with working diagnosisofright heart failure,acute on chronic, in the setting of positive COVID-19 PCR.  Reason for Visit: Acute on chronic diastolic CHF.  Consultants: None  Procedures: None  Antibiotics: Anti-infectives (From admission, onward)   None      Subjective/Interval History: Patient states that his appetite is improving.  He denies any shortness of breath.  He was able to sit out of bed to chair yesterday.  Is looking forward to walking today.   Assessment/Plan:  Acute on chronic diastolic CHF Patient had bilateral pleural effusion on CT scan.  He was diuresed with Lasix.  He is off of diuretics due to poor oral intake.  Volume status is stable.  COVID-19 infection Initially positive in August.  Also noted to be positive again prior to this admission. Repeat COVID-19 test was ordered to assist with placement however patient has adamantly refused.  Reordered today.  Hopefully he will be more agreeable.    Poor oral intake Patient with very poor oral intake here in the hospital.  Patient was started on megestrol.  Patient's oral intake has remained poor despite this.  But today he tells me that his appetite seems to be returning.  He apparently ate a lot of for supper  yesterday but not documented.  Hopefully this is true.  Even though he is noted to have cholelithiasis, that seems to be asymptomatic at this time.  And probably not contributing to his poor appetite.    Chronic atrial fibrillation/with mild RVR Heart rate is stable.  He occasionally has tachycardia.  Continue metoprolol.  He is on warfarin for anticoagulation.  INR is therapeutic.  Thyroid function tests were normal in October on care everywhere.   Chronic kidney disease stage IIIa/hypokalemia Renal function is stable.  Potassium is improved.  Monitor urine output.  Dyslipidemia Continue statin.  History of dementia Stable  Cholelithiasis Ultrasound showed gallstones with sludge and wall thickening.  Concern for chronic cholecystitis.  However patient asymptomatic.  Abdomen remains benign  Family communication Patient's power of attorney is [name] who lives in [place].  [name] is the husband of patient's cousin.  He is also patient's healthcare power of attorney.  Patient's has another cousin called Jill PolingStephen Starling who lives in [place].    Assessment of capacity Please see note from 11/15 for capacity evaluation.  Based on my evaluation I do not think that this patient has capacity to make an informed decision regarding his living situation and placement.  Goals of care Patient's prognosis remains guarded.  He has had poor oral intake for the last several days although he tells me today that his appetite is better.  He should continue to mobilize.  PT needs to work with him.  I have discussed the situation with patient's healthcare power of attorney Mr. [name].  He is aware of all of the above.  Patient remains DNR.  If his oral intake improves then we  can just let him go to a skilled nursing facility.  If it does not then might need to consider hospice at the skilled nursing facility.    DVT Prophylaxis: On warfarin PUD Prophylaxis: On Protonix Code Status:  DNR Family Communication: Discussed with Mr. Hall Busing who is patient's power of attorney Disposition Plan: Skilled nursing facility.  Social worker is following.  Apparently there is some issue with his insurance.  Patient has been declining to go to SNF. However he does not have capacity.  He lives by himself.  See above.  COVID-19 retest has been again ordered today .   Medications:  Scheduled:  ARIPiprazole  10 mg Oral QHS   aspirin EC  81 mg Oral Daily   capsaicin   Topical BID   chlorpheniramine-HYDROcodone  5 mL Oral Q12H   feeding supplement (ENSURE ENLIVE)  237 mL Oral BID BM   megestrol  400 mg Oral Daily   metoprolol succinate  75 mg Oral Daily   pantoprazole  40 mg Oral Daily   rosuvastatin  20 mg Oral q1800   vitamin C  500 mg Oral Daily   Warfarin - Pharmacist Dosing Inpatient   Does not apply q1800   zinc sulfate  220 mg Oral Daily   Continuous:  XKG:[id number] **OR** acetaminophen, guaiFENesin-dextromethorphan, hydrALAZINE, ondansetron **OR** ondansetron (ZOFRAN) IV   Objective:  Vital Signs  Vitals:   05/10/19 0600 05/10/19 0607 05/10/19 0614 05/10/19 0738  BP:   (!) 153/97 116/71  Pulse: (!) 108 100 99 98  Resp:   18   Temp:   98.3 F (36.8 C) 98 F (36.7 C)  TempSrc:   Oral Oral  SpO2: 100% 98% 99% 99%  Weight:      Height:        Intake/Output Summary (Last 24 hours) at 05/10/2019 1051 Last data filed at 05/10/2019 0651 Gross per 24 hour  Intake 300 ml  Output 450 ml  Net -150 ml   Filed Weights   04/28/19 1815  Weight: 77.1 kg   General appearance: Awake alert.  In no distress.  Distracted Resp: Clear to auscultation bilaterally.  Normal effort Cardio: S1-S2 is normal regular.  No S3-S4.  No rubs murmurs or bruit GI: Abdomen is soft.  Nontender nondistended.  Bowel sounds are present normal.  No masses organomegaly Extremities: No edema.   Neurologic:  Distracted.  No focal neurological deficits.   Lab Results:  Data  Reviewed: I have personally reviewed following labs and imaging studies  CBC: Recent Labs  Lab 05/05/19 0500 05/06/19 0128 05/07/19 0349 05/08/19 0425 05/09/19 0305  WBC 16.7* 12.0* 10.7* 9.9 9.5  HGB 11.4* 10.5* 11.2* 11.1* 11.3*  HCT 36.2* 33.1* 35.8* 35.7* 36.6*  MCV 91.6 91.4 91.3 92.5 92.4  PLT 383 357 367 347 335    Basic Metabolic Panel: Recent Labs  Lab 05/05/19 0500 05/06/19 0128 05/07/19 0349 05/08/19 0425 05/09/19 0305  NA 143 145 144 143 144  K 3.1* 3.1* 3.2* 3.9 4.0  CL 103 107 106 106 106  CO2 28 26 27 26 27   GLUCOSE 132* 89 98 87 91  BUN 30* 30* 28* 20 18  CREATININE 1.46* 1.44* 1.52* 1.37* 1.44*  CALCIUM 9.0 8.9 9.1 9.2 9.2  MG 2.2  --  2.2  --   --     GFR: Estimated Creatinine Clearance: 37.2 mL/min (A) (by C-G formula based on SCr of 1.44 mg/dL (H)).  Liver Function Tests: Recent Labs  Lab 05/05/19  0500 05/06/19 0128 05/07/19 0349  AST 42* 55* 77*  ALT 27 30 40  ALKPHOS 216* 204* 218*  BILITOT 1.0 0.7 0.6  PROT 8.1 7.3 7.7  ALBUMIN 2.6* 2.3* 2.5*    Coagulation Profile: Recent Labs  Lab 05/06/19 0128 05/07/19 0349 05/08/19 0425 05/09/19 0305 05/10/19 0410  INR 3.0* 3.1* 2.6* 2.5* 2.4*    CBG: No results for input(s): GLUCAP in the last 168 hours.  Radiology Studies: No results found.     LOS: 13 days   Kyleigha Markert [employer] on [email or web address]  05/10/2019, 10:51 AM

## 2019-05-10 NOTE — Progress Notes (Signed)
Physical Therapy Treatment Patient Details Name: Marcus Quinn MRN: [id number] DOB: 1926/09/19 Today's Date: 05/10/2019    History of Present Illness 85 year old male past medical history of chronic atrial fibrillation, aortic stenosis (sp TAVR), dyslipidemia, coronary artery disease.Diagnosed with COVID-09 February 2019,had been at [facility], recently [facility] home where he lives alone. Patient  experienced a rapid decline in his physical functional capacity ,dyspnea and lower extremity edema. Patient tested for positive for COVID-19 again. Admitted to [facility] from [facility] 11/4.    PT Comments    Pt doing much better with mobility this am, he is needing mod a for bed mobility, sit<>stand from bed with RW and also ambulation in room approx 14ft w/ RW. Pt is on room air and saturations remain in high 80s and 90s throughout session.     Follow Up Recommendations  SNF;Supervision/Assistance - 24 hour     Equipment Recommendations  None recommended by PT    Recommendations for Other Services       Precautions / Restrictions Precautions Precautions: Fall Restrictions Weight Bearing Restrictions: No    Mobility  Bed Mobility Overal bed mobility: Needs Assistance Bed Mobility: Sit to Supine     Supine to sit: Mod assist;HOB elevated     General bed mobility comments: uses bed rails  Transfers Overall transfer level: Needs assistance Equipment used: Rolling walker (2 wheeled) Transfers: Sit to/from Stand Sit to Stand: Mod assist            Ambulation/Gait Ambulation/Gait assistance: Mod assist Gait Distance (Feet): 28 Feet Assistive device: Rolling walker (2 wheeled) Gait Pattern/deviations: Wide base of support;Shuffle Gait velocity: very slow   General Gait Details: was able to ambulate in room today, takes increased time to complete but does very well with amblation. at one point stated that his legs were giving out but with encouragement was able to complete distance   Stairs              Wheelchair Mobility    Modified Rankin (Stroke Patients Only)       Balance Overall balance assessment: Needs assistance Sitting-balance support: Feet unsupported Sitting balance-Leahy Scale: Fair     Standing balance support: During functional activity;Bilateral upper extremity supported Standing balance-Leahy Scale: Fair                              Cognition Arousal/Alertness: Awake/alert Behavior During Therapy: Anxious Overall Cognitive Status: No family/caregiver present to determine baseline cognitive functioning                                 General Comments: seems to be more alert today than previous sessions      Exercises      General Comments General comments (skin integrity, edema, etc.): Pt in better mood today and is very pleasant with therapist. he is agreeable to all tasks given and with encouragement able to complete all with mod a and rw. Pt was on room air today and managed to maintain sats in high 80s and into 90s.      Pertinent Vitals/Pain Pain Assessment: No/denies pain    Home Living                      Prior Function            PT Goals (current goals can now be found in the care  plan section) Acute Rehab PT Goals Patient Stated Goal: did not state goals but is more agreeable to tx and is more pleasant with therapist Time For Goal Achievement: 05/13/19 Potential to Achieve Goals: Fair Progress towards PT goals: Progressing toward goals    Frequency    Min 2X/week      PT Plan Current plan remains appropriate    Co-evaluation              AM-PAC PT "6 Clicks" Mobility   Outcome Measure  Help needed turning from your back to your side while in a flat bed without using bedrails?: None Help needed moving from lying on your back to sitting on the side of a flat bed without using bedrails?: A Lot Help needed moving to and from a bed to a chair (including a wheelchair)?: A  Lot Help needed standing up from a chair using your arms (e.g., wheelchair or bedside chair)?: A Lot Help needed to walk in hospital room?: A Lot Help needed climbing 3-5 steps with a railing? : A Lot 6 Click Score: 14    End of Session Equipment Utilized During Treatment: Gait belt Activity Tolerance: Patient tolerated treatment well Patient left: in chair;with call bell/phone within reach;with chair alarm set Nurse Communication: Mobility status PT Visit Diagnosis: Muscle weakness (generalized) (M62.81);Unsteadiness on feet (R26.81);Difficulty in walking, not elsewhere classified (R26.2)     Time: 3710-6269 PT Time Calculation (min) (ACUTE ONLY): 23 min  Charges:  $Gait Training: 8-22 mins $Therapeutic Activity: 8-22 mins                     Drema Pry, PT    Freddi Starr 05/10/2019, 1:56 PM

## 2019-05-11 DIAGNOSIS — I482 Chronic atrial fibrillation, unspecified: Secondary | ICD-10-CM

## 2019-05-11 LAB — PROTIME-INR
INR: 2.4 — ABNORMAL HIGH (ref 0.8–1.2)
Prothrombin Time: 26.4 seconds — ABNORMAL HIGH (ref 11.4–15.2)

## 2019-05-11 LAB — COMPREHENSIVE METABOLIC PANEL
ALT: 31 U/L (ref 0–44)
AST: 36 U/L (ref 15–41)
Albumin: 2.7 g/dL — ABNORMAL LOW (ref 3.5–5.0)
Alkaline Phosphatase: 186 U/L — ABNORMAL HIGH (ref 38–126)
Anion gap: 11 (ref 5–15)
BUN: 16 mg/dL (ref 8–23)
CO2: 26 mmol/L (ref 22–32)
Calcium: 9.4 mg/dL (ref 8.9–10.3)
Chloride: 103 mmol/L (ref 98–111)
Creatinine, Ser: 1.49 mg/dL — ABNORMAL HIGH (ref 0.61–1.24)
GFR calc Af Amer: 47 mL/min — ABNORMAL LOW (ref >60)
GFR calc non Af Amer: 41 mL/min — ABNORMAL LOW (ref >60)
Glucose, Bld: 84 mg/dL (ref 70–99)
Potassium: 4.3 mmol/L (ref 3.5–5.1)
Sodium: 140 mmol/L (ref 135–145)
Total Bilirubin: 1 mg/dL (ref 0.3–1.2)
Total Protein: 8 g/dL (ref 6.5–8.1)

## 2019-05-11 LAB — SARS CORONAVIRUS 2 (TAT 6-24 HRS): SARS Coronavirus 2: NEGATIVE

## 2019-05-11 MED ORDER — WARFARIN SODIUM 1 MG PO TABS
1.0000 mg | ORAL_TABLET | Freq: Once | ORAL | Status: AC
  Administered 2019-05-11: 1 mg via ORAL
  Filled 2019-05-11: qty 1

## 2019-05-11 NOTE — Progress Notes (Signed)
Occupational Therapy Treatment Patient Details Name: Marcus Quinn MRN: [id number] DOB: 1927/05/30 Today's Date: 05/11/2019    History of present illness 85 year old male past medical history of chronic atrial fibrillation, aortic stenosis (sp TAVR), dyslipidemia, coronary artery disease.Diagnosed with COVID-09 February 2019,had been at [facility], recently [facility] home where he lives alone. Patient  experienced a rapid decline in his physical functional capacity ,dyspnea and lower extremity edema. Patient tested for positive for COVID-19 again. Admitted to [facility] from [facility] 11/4.   OT comments  Pt requiring encouragement to participate this session, initially agreeable to sitting EOB but able to progress OOB to recliner by end of session. Pt requiring overall minA for functional transfers. He continues to present with weakness and poor activity tolerance. Pt on RA with SpO2 >/=86% with activity, mild WOB noted post transfer but improves with 1-2 min of seated rest. Continued encouragement of PO intake provided. Will continue per POC.   Follow Up Recommendations  SNF;Supervision/Assistance - 24 hour    Equipment Recommendations  Other (comment);3 in 1 bedside commode(defer to next venue)          Precautions / Restrictions Precautions Precautions: Fall Restrictions Weight Bearing Restrictions: No       Mobility Bed Mobility Overal bed mobility: Needs Assistance Bed Mobility: Supine to Sit     Supine to sit: Mod assist;HOB elevated     General bed mobility comments: assist for trunk elevation and to scoot hips towards EOB  Transfers Overall transfer level: Needs assistance Equipment used: Rolling walker (2 wheeled) Transfers: Sit to/from [facility] Sit to Stand: Mod assist Stand pivot transfers: Min assist       General transfer comment: boosting assist from EOB, once in standing pt able to takefew steps to recliner with steadying assist throughout, some assist for RW  navigation    Balance Overall balance assessment: Needs assistance Sitting-balance support: Feet unsupported Sitting balance-Leahy Scale: Fair     Standing balance support: During functional activity;Bilateral upper extremity supported Standing balance-Leahy Scale: Poor Standing balance comment: reliant on UE support/external assist                           ADL either performed or assessed with clinical judgement   ADL Overall ADL's : Needs assistance/impaired Eating/Feeding: Set up;Sitting Eating/Feeding Details (indicate cue type and reason): setup for drink containers, 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 so using bil hands to hold cup                                  Functional mobility during ADLs: Minimal assistance;Rolling walker                         Cognition Arousal/Alertness: Awake/alert Behavior During Therapy: WFL for tasks assessed/performed;Agitated Overall Cognitive Status: No family/caregiver present to determine baseline cognitive functioning                           Safety/Judgement: Decreased awareness of deficits;Decreased awareness of safety Awareness: Emergent   General Comments: pt required more encouragement to participate today         Exercises     Shoulder Instructions       General Comments      Pertinent Vitals/ Pain       Pain Assessment: Faces Faces Pain Scale: Hurts a  little bit Pain Location: generalized with mobility Pain Descriptors / Indicators: Discomfort Pain Intervention(s): Limited activity within patient's tolerance;Monitored during session;Repositioned  Home Living                                          Prior Functioning/Environment              Frequency  Min 2X/week        Progress Toward Goals  OT Goals(current goals can now be found in the care plan section)  Progress towards OT goals: Progressing toward goals  Acute Rehab OT  Goals Patient Stated Goal: wants to go home, doesn't want to go to SNF OT Goal Formulation: With patient Time For Goal Achievement: 05/13/19 Potential to Achieve Goals: Good ADL Goals Pt Will Perform Grooming: with supervision;sitting Pt Will Perform Lower Body Bathing: sit to/from stand;with min guard assist Pt Will Perform Upper Body Dressing: with supervision;with set-up;sitting Pt Will Perform Lower Body Dressing: sit to/from stand;with min guard assist Pt Will Transfer to Toilet: with min guard assist;ambulating;stand pivot transfer Pt Will Perform Toileting - Clothing Manipulation and hygiene: with min guard assist;sit to/from stand  Plan Discharge plan remains appropriate    Co-evaluation                 AM-PAC OT "6 Clicks" Daily Activity     Outcome Measure   Help from another person eating meals?: A Little Help from another person taking care of personal grooming?: A Little Help from another person toileting, which includes using toliet, bedpan, or urinal?: A Lot Help from another person bathing (including washing, rinsing, drying)?: A Lot Help from another person to put on and taking off regular upper body clothing?: A Little Help from another person to put on and taking off regular lower body clothing?: A Lot 6 Click Score: 15    End of Session Equipment Utilized During Treatment: Gait belt;Rolling walker  OT Visit Diagnosis: Unsteadiness on feet (R26.81);Muscle weakness (generalized) (M62.81);Other symptoms and signs involving cognitive function   Activity Tolerance Patient tolerated treatment well   Patient Left in chair;with call bell/phone within reach;with chair alarm set   Nurse Communication Mobility status        Time: 1051-1128(-approx 10 min assisting RN with covid swab) OT Time Calculation (min): 37 min  Charges: OT General Charges $OT Visit: 1 Visit OT Treatments $Self Care/Home Management : 23-37 mins  Marcy Siren,  OT Supplemental Rehabilitation Services Pager [phone] Office [phone]    Orlando Penner 05/11/2019, 4:15 PM

## 2019-05-11 NOTE — Progress Notes (Signed)
ANTICOAGULATION CONSULT NOTE - Follow Up Consult  Pharmacy Consult for Warfarin Indication: atrial fibrillation  No Known Allergies  Patient Measurements: Height: 6' (182.9 cm) Weight: 170 lb (77.1 kg) IBW/kg (Calculated) : 77.6  Vital Signs: Temp: 98.1 F (36.7 C) (11/18 0725) Temp Source: Oral (11/18 0725) BP: 117/73 (11/18 0725) Pulse Rate: 93 (11/18 0725)  Labs: Recent Labs    05/09/19 0305 05/10/19 0410 05/11/19 0150  HGB 11.3*  --   --   HCT 36.6*  --   --   PLT 335  --   --   LABPROT 26.8* 25.7* 26.4*  INR 2.5* 2.4* 2.4*  CREATININE 1.44*  --  1.49*    Estimated Creatinine Clearance: 35.9 mL/min (A) (by C-G formula based on SCr of 1.49 mg/dL (H)).   Medications:  Scheduled:  . ARIPiprazole  10 mg Oral QHS  . aspirin EC  81 mg Oral Daily  . capsaicin   Topical BID  . chlorpheniramine-HYDROcodone  5 mL Oral Q12H  . feeding supplement (ENSURE ENLIVE)  237 mL Oral BID BM  . megestrol  400 mg Oral Daily  . metoprolol succinate  75 mg Oral Daily  . pantoprazole  40 mg Oral Daily  . rosuvastatin  20 mg Oral q1800  . vitamin C  500 mg Oral Daily  . warfarin  1 mg Oral ONCE-1800  . Warfarin - Pharmacist Dosing Inpatient   Does not apply q1800  . zinc sulfate  220 mg Oral Daily   Infusions:    Assessment: 89 yoM on PTA warfarin 5mg  daily for Afib, resumed inpatient. Doses held from11/7-11/11 - likely due to zero oral intake. Have been using smaller doses past few days and appetite seems to be improving. INR therapeutic at 2.4.  Hgb and plts stable.  No bleeding or complications reported.  Goal of Therapy:  INR 2-3 Monitor platelets by anticoagulation protocol: Yes   Plan:  Warfarin 1 mg PO today Monitor daily INR, CBC, s/s of bleed  Marcus Quinn

## 2019-05-11 NOTE — Progress Notes (Signed)
Occupational Therapy Treatment Patient Details Name: Marcus Quinn MRN: [id number] DOB: 18-Jul-1926 Today's Date: 05/11/2019    History of present illness 85 year old male past medical history of chronic atrial fibrillation, aortic stenosis (sp TAVR), dyslipidemia, coronary artery disease.Diagnosed with COVID-09 February 2019,had been at [facility], recently DC'd home where he lives alone. Patient  experienced a rapid decline in his physical functional capacity ,dyspnea and lower extremity edema. Patient tested for positive for COVID-19 again. Admitted to [facility] from [facility] 11/4.   OT comments  Assisted pt with transfer back to bed after lunch. Pt with increased fatigue/weakness after sitting up and requiring significant assist for functional transfer; requiring max/totalA for stand pivot transfer. Once repositioned in bed pt engaging in additional grooming/self-feeding ADL. Encouraged continued PO intake. Will continue per POC.   Follow Up Recommendations  SNF;Supervision/Assistance - 24 hour    Equipment Recommendations  Other (comment);3 in 1 bedside commode(defer to next venue)    Recommendations for Other Services      Precautions / Restrictions Precautions Precautions: Fall Restrictions Weight Bearing Restrictions: No       Mobility Bed Mobility Overal bed mobility: Needs Assistance Bed Mobility: Sit to Supine     Supine to sit: Mod assist;HOB elevated Sit to supine: Mod assist   General bed mobility comments: assist for LEs onto EOB  Transfers Overall transfer level: Needs assistance Equipment used: Rolling walker (2 wheeled);1 person hand held assist Transfers: Sit to/from [employer] Transfers Sit to Stand: Max assist Stand pivot transfers: Max assist;Total assist       General transfer comment: pt with increased fatigue with attempts to stand after being up in recliner, attempted to stand at RW x2, pt able to acheive partial stand with maxA but not able to maintain upright;  utilized face to face method for stand pivot back to EOB with max/totalA (?pt with very minimal effort to assist)     Balance Overall balance assessment: Needs assistance Sitting-balance support: Feet unsupported Sitting balance-Leahy Scale: Fair     Standing balance support: During functional activity;Bilateral upper extremity supported Standing balance-Leahy Scale: Poor Standing balance comment: reliant on UE support/external assist                           ADL either performed or assessed with clinical judgement   ADL Overall ADL's : Needs assistance/impaired Eating/Feeding: Set up;Sitting Eating/Feeding Details (indicate cue type and reason): setup for drink containers,continued to encourage PO intake as pt with minimally eaten lunch Grooming: Set up;Bed level;Wash/dry face                               Functional mobility during ADLs: Maximal assistance(HHA) General ADL Comments: assisted with back to bed this PM; pt with increased fatigue/weakness with transfer back to bed requiring significant assist to perform     Vision       Perception     Praxis      Cognition Arousal/Alertness: Awake/alert Behavior During Therapy: WFL for tasks assessed/performed;Agitated Overall Cognitive Status: No family/caregiver present to determine baseline cognitive functioning                           Safety/Judgement: Decreased awareness of deficits;Decreased awareness of safety Awareness: Emergent   General Comments: remains with decreased insight into current deficits, notable confusion with certain statements made, ?some paranoia stating staff are hiding  his cell phone        Exercises     Shoulder Instructions       General Comments      Pertinent Vitals/ Pain       Pain Assessment: Faces Faces Pain Scale: Hurts a little bit Pain Location: generalized with mobility, back from sitting in recliner Pain Descriptors / Indicators:  Discomfort Pain Intervention(s): Limited activity within patient's tolerance;Monitored during session;Repositioned  Home Living                                          Prior Functioning/Environment              Frequency  Min 2X/week        Progress Toward Goals  OT Goals(current goals can now be found in the care plan section)  Progress towards OT goals: Progressing toward goals  Acute Rehab OT Goals Patient Stated Goal: wants to go home, doesn't want to go to SNF OT Goal Formulation: With patient Time For Goal Achievement: 05/13/19 Potential to Achieve Goals: Good ADL Goals Pt Will Perform Grooming: with supervision;sitting Pt Will Perform Lower Body Bathing: sit to/from stand;with min guard assist Pt Will Perform Upper Body Dressing: with supervision;with set-up;sitting Pt Will Perform Lower Body Dressing: sit to/from stand;with min guard assist Pt Will Transfer to Toilet: with min guard assist;ambulating;stand pivot transfer Pt Will Perform Toileting - Clothing Manipulation and hygiene: with min guard assist;sit to/from stand  Plan Discharge plan remains appropriate    Co-evaluation                 AM-PAC OT "6 Clicks" Daily Activity     Outcome Measure   Help from another person eating meals?: A Little Help from another person taking care of personal grooming?: A Little Help from another person toileting, which includes using toliet, bedpan, or urinal?: A Lot Help from another person bathing (including washing, rinsing, drying)?: A Lot Help from another person to put on and taking off regular upper body clothing?: A Little Help from another person to put on and taking off regular lower body clothing?: A Lot 6 Click Score: 15    End of Session Equipment Utilized During Treatment: Gait belt;Rolling walker  OT Visit Diagnosis: Unsteadiness on feet (R26.81);Muscle weakness (generalized) (M62.81);Other symptoms and signs involving  cognitive function   Activity Tolerance Patient tolerated treatment well;Patient limited by fatigue   Patient Left with call bell/phone within reach;in bed   Nurse Communication Mobility status        Time: 1335-1411 OT Time Calculation (min): 36 min  Charges: OT General Charges $OT Visit: 1 Visit OT Treatments $Self Care/Home Management : 23-37 mins  Lou Cal, OT Supplemental Rehabilitation Services Pager [phone] Office [phone]    Raymondo Band 05/11/2019, 4:24 PM

## 2019-05-11 NOTE — Progress Notes (Signed)
PROGRESS NOTE  Marcus Quinn BTC:[id number] DOB: 1927/04/06 DOA: 04/27/2019  PCP: Melony Overly, MD  Brief History/Interval Summary: 85 year old male past medical history of chronic atrial fibrillation, aortic stenosis(spTAVR),dyslipidemia, coronary artery disease.Recently diagnosed with COVID-09 February 2019,apparently he has been at a skilled nursing facility and then discharged home. At his home he lives alone, he experienced a rapid decline in his physical functional capacity associated with dyspnea and lower extremity edema. CT chest with bilateral pleural effusions,more on theleft. Abnormal appearance of the gallbladder.Possible acute cholecystitis. Patient was admitted to the hospital with working diagnosisofright heart failure,acute on chronic, in the setting of positive COVID-19 PCR.  Reason for Visit: Acute on chronic diastolic CHF.  Consultants: None  Procedures: None  Antibiotics: Anti-infectives (From admission, onward)   None      Subjective/Interval History: Patient denies any shortness of breath today, as discussed with staff was able to sit out of bed to chair today, , he himself denies any complaints , was agreeable to repeat COVID-19 swab for placement .   Assessment/Plan:  Acute on chronic diastolic CHF Patient had bilateral pleural effusion on CT scan.  He was diuresed with Lasix.  He is off of diuretics due to poor oral intake.  Volume status is stable.  COVID-19 infection Initially positive in August.  Also noted to be positive again prior to this admission. Repeat COVID-19 test was ordered to assist with placement however patient has adamantly refused.  Patient was agreeable today after I have discussed with him, and the nurse discussed with him as well, swab was sent today .  Poor oral intake Patient with very poor oral intake here in the hospital.  Patient was started on megestrol.  Patient's oral intake has remained poor despite this.  But  today he tells me that his appetite seems to be returning.  He apparently ate a lot of for supper yesterday but not documented.  Hopefully this is true.  Even though he is noted to have cholelithiasis, that seems to be asymptomatic at this time.  And probably not contributing to his poor appetite.    Chronic atrial fibrillation/with mild RVR Heart rate is stable.  He occasionally has tachycardia.  Continue metoprolol.  He is on warfarin for anticoagulation.  INR is therapeutic.  Thyroid function tests were normal in October on care everywhere.   Chronic kidney disease stage IIIa/hypokalemia Renal function is stable.  Potassium is improved.  Monitor urine output.  Dyslipidemia Continue statin.  History of dementia Stable  Cholelithiasis Ultrasound showed gallstones with sludge and wall thickening.  Concern for chronic cholecystitis.  However patient asymptomatic.  Abdomen remains benign  Family communication Patient's power of attorney is Marcus Quinn who lives in [place].  Marcus Quinn is the husband of patient's cousin.  He is also patient's healthcare power of attorney.  Patient's has another cousin called Marcus Quinn who lives in [place].    Assessment of capacity Please see note from 11/15 for capacity evaluation.  Based on previous MD evaluation, at my own evaluation I do not think that this patient has capacity to make an informed decision regarding his living situation and placement.  Goals of care Patient's prognosis remains guarded.  He has had poor oral intake for the last several days although he tells me today that his appetite is better.  He should continue to mobilize.  PT needs to work with him.  I have discussed the situation with patient's healthcare power of attorney Mr. Marcus Quinn.  He is aware of all of the above.  Patient remains DNR.  If his oral intake improves then we can just let him go to a skilled nursing facility.  If it does not then might need to consider  hospice at the skilled nursing facility.    DVT Prophylaxis: On warfarin PUD Prophylaxis: On Protonix Code Status: DNR Family Communication: previous MD Discussed with Mr. Hall Busing who is patient's power of attorney Disposition Plan: Skilled nursing facility.  Social worker is following.  Apparently there is some issue with his insurance.  Patient seems to be more agreeable to SNF placement today as I discussed with him .    Medications:  Scheduled:  ARIPiprazole  10 mg Oral QHS   aspirin EC  81 mg Oral Daily   capsaicin   Topical BID   chlorpheniramine-HYDROcodone  5 mL Oral Q12H   feeding supplement (ENSURE ENLIVE)  237 mL Oral BID BM   megestrol  400 mg Oral Daily   metoprolol succinate  75 mg Oral Daily   pantoprazole  40 mg Oral Daily   rosuvastatin  20 mg Oral q1800   vitamin C  500 mg Oral Daily   warfarin  1 mg Oral ONCE-1800   Warfarin - Pharmacist Dosing Inpatient   Does not apply q1800   zinc sulfate  220 mg Oral Daily   Continuous:  KMQ:[id number] **OR** acetaminophen, guaiFENesin-dextromethorphan, hydrALAZINE, ondansetron **OR** ondansetron (ZOFRAN) IV   Objective:  Vital Signs  Vitals:   05/10/19 1605 05/10/19 1927 05/11/19 0634 05/11/19 0725  BP: 106/62 112/71 124/74 117/73  Pulse: 96 98 95 93  Resp:  18 18   Temp: 98.4 F (36.9 C) 99.4 F (37.4 C) 97.9 F (36.6 C) 98.1 F (36.7 C)  TempSrc: Oral   Oral  SpO2: 99% 100% 99%   Weight:      Height:       No intake or output data in the 24 hours ending 05/11/19 1322 Filed Weights   04/28/19 1815  Weight: 77.1 kg   General appearance: Awake, alert, did to date, time and place, but appears to be easily distracted, in no apparent distress, tremulous frail . Inspiratory with good air entry bilaterally, no wheezing  Regular rate and rhythm, no rubs murmurs or gallops  GI: Abdomen is soft.  Nontender nondistended.  Bowel sounds are present normal.  No masses organomegaly Extremities:  No edema.   Neurologic:  Distracted.  No focal neurological deficits.   Lab Results:  Data Reviewed: I have personally reviewed following labs and imaging studies  CBC: Recent Labs  Lab 05/05/19 0500 05/06/19 0128 05/07/19 0349 05/08/19 0425 05/09/19 0305  WBC 16.7* 12.0* 10.7* 9.9 9.5  HGB 11.4* 10.5* 11.2* 11.1* 11.3*  HCT 36.2* 33.1* 35.8* 35.7* 36.6*  MCV 91.6 91.4 91.3 92.5 92.4  PLT 383 357 367 347 335    Basic Metabolic Panel: Recent Labs  Lab 05/05/19 0500 05/06/19 0128 05/07/19 0349 05/08/19 0425 05/09/19 0305 05/11/19 0150  NA 143 145 144 143 144 140  K 3.1* 3.1* 3.2* 3.9 4.0 4.3  CL 103 107 106 106 106 103  CO2 28 26 27 26 27 26   GLUCOSE 132* 89 98 87 91 84  BUN 30* 30* 28* 20 18 16   CREATININE 1.46* 1.44* 1.52* 1.37* 1.44* 1.49*  CALCIUM 9.0 8.9 9.1 9.2 9.2 9.4  MG 2.2  --  2.2  --   --   --     GFR: Estimated Creatinine Clearance:  35.9 mL/min (A) (by C-G formula based on SCr of 1.49 mg/dL (H)).  Liver Function Tests: Recent Labs  Lab 05/05/19 0500 05/06/19 0128 05/07/19 0349 05/11/19 0150  AST 42* 55* 77* 36  ALT 27 30 40 31  ALKPHOS 216* 204* 218* 186*  BILITOT 1.0 0.7 0.6 1.0  PROT 8.1 7.3 7.7 8.0  ALBUMIN 2.6* 2.3* 2.5* 2.7*    Coagulation Profile: Recent Labs  Lab 05/07/19 0349 05/08/19 0425 05/09/19 0305 05/10/19 0410 05/11/19 0150  INR 3.1* 2.6* 2.5* 2.4* 2.4*    CBG: No results for input(s): GLUCAP in the last 168 hours.  Radiology Studies: No results found.     LOS: 14 days   Huey Bienenstockawood Venora Kautzman MD  [facility] Pager on [email or web address]  05/11/2019, 1:22 PM

## 2019-05-11 NOTE — Plan of Care (Signed)
Pt stable throughout shift with no signs of distress. Education provided. Pt teaching needs reinforcement. Continuing to monitor pt. Safety maintained.

## 2019-05-12 DIAGNOSIS — I509 Heart failure, unspecified: Secondary | ICD-10-CM

## 2019-05-12 LAB — PROTIME-INR
INR: 2.5 — ABNORMAL HIGH (ref 0.8–1.2)
Prothrombin Time: 27.1 seconds — ABNORMAL HIGH (ref 11.4–15.2)

## 2019-05-12 MED ORDER — SODIUM CHLORIDE 0.9 % IV SOLN: INTRAVENOUS | Status: DC

## 2019-05-12 MED ORDER — WARFARIN SODIUM 1 MG PO TABS
1.0000 mg | ORAL_TABLET | Freq: Once | ORAL | Status: AC
  Administered 2019-05-12: 1 mg via ORAL
  Filled 2019-05-12: qty 1

## 2019-05-12 NOTE — Plan of Care (Signed)
Patient on RA with O2 Saturation 100%. Spoke with pt friend Marcus Quinn on pt plan of care. [name] stated understanding. Continuing to monitor pt. Safety maintained.

## 2019-05-12 NOTE — Progress Notes (Signed)
PROGRESS NOTE  Marcus Quinn RKY:[id number] DOB: 29-Aug-1926 DOA: 04/27/2019  PCP: Marcus Overly, MD  Brief History/Interval Summary: 85 year old male past medical history of chronic atrial fibrillation, aortic stenosis(spTAVR),dyslipidemia, coronary artery disease.Recently diagnosed with COVID-09 February 2019,apparently he has been at a skilled nursing facility and then discharged home. At his home he lives alone, he experienced a rapid decline in his physical functional capacity associated with dyspnea and lower extremity edema. CT chest with bilateral pleural effusions,more on theleft. Abnormal appearance of the gallbladder.Possible acute cholecystitis. Patient was admitted to the hospital with working diagnosisofright heart failure,acute on chronic, in the setting of positive COVID-19 PCR.  Reason for Visit: Acute on chronic diastolic CHF.  Consultants: None  Procedures: None  Antibiotics: Anti-infectives (From admission, onward)   None      Subjective/Interval History: Patient denies any shortness of breath, COVID-19 test came back negative yesterday, patient tells me his appetite has been improving .   Assessment/Plan:  Acute on chronic diastolic CHF Patient had bilateral pleural effusion on CT scan.  He was diuresed with Lasix.  He is off of diuretics due to poor oral intake.  Volume status is stable.  Actually I have encouraged him today to increase his fluid intake.  COVID-19 infection Initially positive in August.  Also noted to be positive again prior to this admission. Repeat COVID-19 test was was negative on 11/18, discussed with Education officer, museum, start looking for SNF placement.  Poor oral intake Patient with very poor oral intake here in the hospital.  Patient was started on megestrol.  Patient's oral intake has remained poor despite this.  But today he tells me that his appetite seems to be returning.  He apparently ate a lot of for supper yesterday but  not documented.  Hopefully this is true.  Even though he is noted to have cholelithiasis, that seems to be asymptomatic at this time.  And probably not contributing to his poor appetite.    Chronic atrial fibrillation/with mild RVR Heart rate is stable.  He occasionally has tachycardia.  Continue metoprolol.  He is on warfarin for anticoagulation.  INR is therapeutic.  Thyroid function tests were normal in October on care everywhere.   Chronic kidney disease stage IIIa/hypokalemia Renal function is stable.  Potassium is improved.  Monitor urine output.  Dyslipidemia Continue statin.  History of dementia Stable  Cholelithiasis Ultrasound showed gallstones with sludge and wall thickening.  Concern for chronic cholecystitis.  However patient asymptomatic.  Abdomen remains benign  Family communication Patient's power of attorney is Marcus Quinn who lives in [place].  Marcus Quinn is the husband of patient's cousin.  He is also patient's healthcare power of attorney.  Patient's has another cousin called Marcus Quinn who lives in [place].    Assessment of capacity Please see note from 11/15 for capacity evaluation.  Based on previous MD evaluation, at my own evaluation I do not think that this patient has capacity to make an informed decision regarding his living situation and placement.  Goals of care Patient's prognosis remains guarded.  He has had poor oral intake for the last several days although he tells me today that his appetite is better.  He should continue to mobilize.  PT needs to work with him.  I have discussed the situation with patient's healthcare power of attorney Mr. Marcus Quinn.  He is aware of all of the above.  Patient remains DNR.  If his oral intake improves then we can just let him  go to a skilled nursing facility.  If it does not then might need to consider hospice at the skilled nursing facility.    DVT Prophylaxis: On warfarin PUD Prophylaxis: On Protonix Code  Status: DNR Family Communication: Discussed with Marcus Quinn who is patient's power of attorney,  Disposition Plan: Skilled nursing facility.  Social worker is following.  Apparently there is some issue with his insurance.  Patient seems to be more agreeable to SNF placement today as I discussed with him .    Medications:  Scheduled: . ARIPiprazole  10 mg Oral QHS  . aspirin EC  81 mg Oral Daily  . capsaicin   Topical BID  . chlorpheniramine-HYDROcodone  5 mL Oral Q12H  . feeding supplement (ENSURE ENLIVE)  237 mL Oral BID BM  . megestrol  400 mg Oral Daily  . metoprolol succinate  75 mg Oral Daily  . pantoprazole  40 mg Oral Daily  . rosuvastatin  20 mg Oral q1800  . vitamin C  500 mg Oral Daily  . warfarin  1 mg Oral ONCE-1800  . Warfarin - Pharmacist Dosing Inpatient   Does not apply q1800  . zinc sulfate  220 mg Oral Daily   Continuous:  ZOX:[id number]:acetaminophen **OR** acetaminophen, guaiFENesin-dextromethorphan, hydrALAZINE, ondansetron **OR** ondansetron (ZOFRAN) IV   Objective:  Vital Signs  Vitals:   05/11/19 0725 05/11/19 1533 05/11/19 1939 05/12/19 0752  BP: 117/73 109/89 113/72 97/74  Pulse: 93 91 88 86  Resp:  18 18 18   Temp: 98.1 F (36.7 C) 98.3 F (36.8 C) 98.8 F (37.1 C) 98.7 F (37.1 C)  TempSrc: Oral Oral  Rectal  SpO2:  98% 100% 100%  Weight:      Height:       No intake or output data in the 24 hours ending 05/12/19 1655 Filed Weights   04/28/19 1815  Weight: 77.1 kg   Awake Alert, Oriented X 3, easily distracted, extremely frail, no new F.N deficits, flat affect  symmetrical Chest wall movement, Good air movement bilaterally, CTAB RRR,No Gallops,Rubs or new Murmurs, No Parasternal Heave +ve B.Sounds, Abd Soft, No tenderness, No rebound - guarding or rigidity. No Cyanosis, Clubbing or edema, No new Rash or bruise      Lab Results:  Data Reviewed: I have personally reviewed following labs and imaging studies  CBC: Recent Labs  Lab  05/06/19 0128 05/07/19 0349 05/08/19 0425 05/09/19 0305  WBC 12.0* 10.7* 9.9 9.5  HGB 10.5* 11.2* 11.1* 11.3*  HCT 33.1* 35.8* 35.7* 36.6*  MCV 91.4 91.3 92.5 92.4  PLT 357 367 347 335    Basic Metabolic Panel: Recent Labs  Lab 05/06/19 0128 05/07/19 0349 05/08/19 0425 05/09/19 0305 05/11/19 0150  NA 145 144 143 144 140  K 3.1* 3.2* 3.9 4.0 4.3  CL 107 106 106 106 103  CO2 26 27 26 27 26   GLUCOSE 89 98 87 91 84  BUN 30* 28* 20 18 16   CREATININE 1.44* 1.52* 1.37* 1.44* 1.49*  CALCIUM 8.9 9.1 9.2 9.2 9.4  MG  --  2.2  --   --   --     GFR: Estimated Creatinine Clearance: 35.9 mL/min (A) (by C-G formula based on SCr of 1.49 mg/dL (H)).  Liver Function Tests: Recent Labs  Lab 05/06/19 0128 05/07/19 0349 05/11/19 0150  AST 55* 77* 36  ALT 30 40 31  ALKPHOS 204* 218* 186*  BILITOT 0.7 0.6 1.0  PROT 7.3 7.7 8.0  ALBUMIN 2.3* 2.5* 2.7*  Coagulation Profile: Recent Labs  Lab 05/08/19 0425 05/09/19 0305 05/10/19 0410 05/11/19 0150 05/12/19 0312  INR 2.6* 2.5* 2.4* 2.4* 2.5*    CBG: No results for input(s): GLUCAP in the last 168 hours.  Radiology Studies: No results found.     LOS: 15 days   Huey Bienenstock MD  [facility] Pager on [email or web address]  05/12/2019, 4:55 PM

## 2019-05-12 NOTE — Progress Notes (Signed)
Nutrition Follow-up  RD working remotely.  DOCUMENTATION CODES:   Not applicable  INTERVENTION:    Continue to offer Ensure Enlive po BID, each supplement provides 350 kcal and 20 grams of protein.  Continue Hormel Shake daily with Breakfast which provides 520 kcals and 22 g of protein and Magic cup BID with lunch and dinner, each supplement provides 290 kcal and 9 grams of protein, automatically on meal trays to optimize nutritional intake.   NUTRITION DIAGNOSIS:   Increased nutrient needs related to acute illness as evidenced by estimated needs.  Ongoing   GOAL:   Patient will meet greater than or equal to 90% of their needs  Unmet   MONITOR:   PO intake, Supplement acceptance, Labs, Weight trends, I & O's  ASSESSMENT:   85 year old male past medical history of chronic atrial fibrillation, aortic stenosis (sp TAVR), dyslipidemia, coronary artery disease.  Recently diagnosed with COVID-09 February 2019, apparently he has been at a skilled nursing facility and discharged posteriorly home.  At his home he lives alone, he experienced a rapid decline in his physical functional capacity associated with dyspnea and lower extremity edema.Patient was admitted to the hospital with working diagnosis right heart failure, acute on chronic, in the setting of positive COVID-19 PCR.  Repeat COVID test negative 11/17. Plans for SNF placement.   Patient continues to eat poorly, consuming 0% of meals, regular diet. Although he says his appetite has improved some. He refused Cortrak feeding tube last week.  He is being offered Ensure Enlive BID, and has accepted it once daily for the past 3 days.  Continues on Megace to stimulate appetite.  Labs and medications reviewed.   Diet Order:   Diet Order            Diet regular Room service appropriate? Yes; Fluid consistency: Thin  Diet effective now        Diet - low sodium heart healthy              EDUCATION NEEDS:   No education  needs have been identified at this time  Skin:  Skin Assessment: Reviewed RN Assessment  Last BM:  unknown  Height:   Ht Readings from Last 1 Encounters:  04/28/19 6' (1.829 m)    Weight:   Wt Readings from Last 1 Encounters:  04/28/19 77.1 kg    Ideal Body Weight:  80.9 kg  BMI:  Body mass index is 23.06 kg/m.  Estimated Nutritional Needs:   Kcal:  1800-2000  Protein:  85-95g  Fluid:  1.8L/day    Molli Barrows, RD, LDN, [place] Pager [phone] After Hours Pager [phone]

## 2019-05-12 NOTE — Progress Notes (Signed)
ANTICOAGULATION CONSULT NOTE - Follow Up Consult  Pharmacy Consult for Warfarin Indication: atrial fibrillation  No Known Allergies  Patient Measurements: Height: 6' (182.9 cm) Weight: 170 lb (77.1 kg) IBW/kg (Calculated) : 77.6  Vital Signs: Temp: 98.7 F (37.1 C) (11/19 0752) Temp Source: Rectal (11/19 0752) BP: 97/74 (11/19 0752) Pulse Rate: 86 (11/19 0752)  Labs: Recent Labs    05/10/19 0410 05/11/19 0150 05/12/19 0312  LABPROT 25.7* 26.4* 27.1*  INR 2.4* 2.4* 2.5*  CREATININE  --  1.49*  --     Estimated Creatinine Clearance: 35.9 mL/min (A) (by C-G formula based on SCr of 1.49 mg/dL (H)).   Medications:  Scheduled:  . ARIPiprazole  10 mg Oral QHS  . aspirin EC  81 mg Oral Daily  . capsaicin   Topical BID  . chlorpheniramine-HYDROcodone  5 mL Oral Q12H  . feeding supplement (ENSURE ENLIVE)  237 mL Oral BID BM  . megestrol  400 mg Oral Daily  . metoprolol succinate  75 mg Oral Daily  . pantoprazole  40 mg Oral Daily  . rosuvastatin  20 mg Oral q1800  . vitamin C  500 mg Oral Daily  . Warfarin - Pharmacist Dosing Inpatient   Does not apply q1800  . zinc sulfate  220 mg Oral Daily   Infusions:    Assessment: 89 yoM on PTA warfarin 5mg  daily for Afib, resumed inpatient. Doses held from11/7-11/11 - likely due to zero oral intake. Have been using smaller doses past few days.  No meal intake charted.  INR remains therapeutic at 2.5.  Hgb and plts stable (last on 11/16).  No bleeding or complications reported.  Goal of Therapy:  INR 2-3 Monitor platelets by anticoagulation protocol: Yes   Plan:  Warfarin 1 mg PO today Monitor daily INR, CBC, s/s of bleed  Gretta Arab PharmD, BCPS Clinical pharmacist phone 7am- 5pm: [phone] 05/12/2019 11:33 AM

## 2019-05-13 LAB — BASIC METABOLIC PANEL
Anion gap: 12 (ref 5–15)
BUN: 17 mg/dL (ref 8–23)
CO2: 24 mmol/L (ref 22–32)
Calcium: 9.5 mg/dL (ref 8.9–10.3)
Chloride: 105 mmol/L (ref 98–111)
Creatinine, Ser: 1.4 mg/dL — ABNORMAL HIGH (ref 0.61–1.24)
GFR calc Af Amer: 51 mL/min — ABNORMAL LOW (ref >60)
GFR calc non Af Amer: 44 mL/min — ABNORMAL LOW (ref >60)
Glucose, Bld: 94 mg/dL (ref 70–99)
Potassium: 4 mmol/L (ref 3.5–5.1)
Sodium: 141 mmol/L (ref 135–145)

## 2019-05-13 LAB — CBC
HCT: 34.1 % — ABNORMAL LOW (ref 39.0–52.0)
Hemoglobin: 10.9 g/dL — ABNORMAL LOW (ref 13.0–17.0)
MCH: 28.8 pg (ref 26.0–34.0)
MCHC: 32 g/dL (ref 30.0–36.0)
MCV: 90 fL (ref 80.0–100.0)
Platelets: 261 10*3/uL (ref 150–400)
RBC: 3.79 MIL/uL — ABNORMAL LOW (ref 4.22–5.81)
RDW: 14.2 % (ref 11.5–15.5)
WBC: 11.7 10*3/uL — ABNORMAL HIGH (ref 4.0–10.5)
nRBC: 0 % (ref 0.0–0.2)

## 2019-05-13 LAB — PROTIME-INR
INR: 2.2 — ABNORMAL HIGH (ref 0.8–1.2)
Prothrombin Time: 24.1 seconds — ABNORMAL HIGH (ref 11.4–15.2)

## 2019-05-13 MED ORDER — MEGESTROL ACETATE 400 MG/10ML PO SUSP: 400.0000 mg | Freq: Every day | ORAL | 0 refills | Status: AC

## 2019-05-13 MED ORDER — WARFARIN SODIUM 1 MG PO TABS: 1.0000 mg | ORAL_TABLET | Freq: Once | ORAL | Status: AC

## 2019-05-13 MED ORDER — WARFARIN SODIUM 1 MG PO TABS
1.0000 mg | ORAL_TABLET | Freq: Once | ORAL | Status: DC
  Filled 2019-05-13: qty 1

## 2019-05-13 NOTE — Care Management Important Message (Signed)
Important Message  Patient Details  Name: Alexy Heldt MRN: [id number] Date of Birth: [birth year]   Medicare Important Message Given:  Yes - Important Message mailed due to current National Emergency   Verbal consent obtained due to current National Emergency  Relationship to patient: Other [employer] (must comment) Contact Name: Lance Morin Call Date: 05/13/19  Time: 1045 Phone: [phone] Outcome: Spoke with contact Important Message mailed to: Other (must enter comment)([address] [place] [address])     Orbie Pyo 05/13/2019, 10:47 AM

## 2019-05-13 NOTE — Progress Notes (Signed)
Physical Therapy Treatment Patient Details Name: Marcus Quinn MRN: [id number] DOB: 30-Jan-1927 Today's Date: 05/13/2019    History of Present Illness 85 year old male past medical history of chronic atrial fibrillation, aortic stenosis (sp TAVR), dyslipidemia, coronary artery disease.Diagnosed with COVID-09 February 2019,had been at [facility], recently DC'd home where he lives alone. Patient  experienced a rapid decline in his physical functional capacity ,dyspnea and lower extremity edema. Patient tested for positive for COVID-19 again. Admitted to [facility] from [facility] 11/4.    PT Comments    Pt not moving as well as previous session, reports that his RLE is hurting him and he can barely move it. With encouragement was able to get oob with min a, sit<>stand with mod a and RW and ambulate a few feet to recliner by window. Noted RLE is swollen compared to LLE gait pattern did not seem to be particularly antalgic. Pt is on room air and sats remained in 90s throughout session.     Follow Up Recommendations  SNF;Supervision/Assistance - 24 hour     Equipment Recommendations  None recommended by PT    Recommendations for Other Services       Precautions / Restrictions Precautions Precautions: Fall Restrictions Weight Bearing Restrictions: No    Mobility  Bed Mobility Overal bed mobility: Needs Assistance Bed Mobility: Sit to Supine     Supine to sit: Min guard        Transfers Overall transfer level: Needs assistance Equipment used: Rolling walker (2 wheeled) Transfers: Sit to/from Stand Sit to Stand: Mod assist            Ambulation/Gait Ambulation/Gait assistance: Min assist Gait Distance (Feet): 5 Feet Assistive device: Rolling walker (2 wheeled) Gait Pattern/deviations: Wide base of support;Shuffle     General Gait Details: only agreeable to very short distance ambulation sec to stated pain in RLE   Stairs             Wheelchair Mobility    Modified Rankin (Stroke  Patients Only)       Balance Overall balance assessment: Needs assistance Sitting-balance support: Feet unsupported Sitting balance-Leahy Scale: Fair     Standing balance support: During functional activity;Bilateral upper extremity supported Standing balance-Leahy Scale: Fair                              Cognition Arousal/Alertness: Awake/alert Behavior During Therapy: WFL for tasks assessed/performed;Agitated Overall Cognitive Status: No family/caregiver present to determine baseline cognitive functioning                                        Exercises      General Comments        Pertinent Vitals/Pain Pain Assessment: 0-10 Pain Score: 6  Pain Location: RLE states its swollen and cannt bare to move it Pain Intervention(s): Limited activity within patient's tolerance    Home Living                      Prior Function            PT Goals (current goals can now be found in the care plan section) Acute Rehab PT Goals Time For Goal Achievement: 05/27/19 Potential to Achieve Goals: Fair Progress towards PT goals: Progressing toward goals    Frequency    Min 2X/week  PT Plan Current plan remains appropriate    Co-evaluation              AM-PAC PT "6 Clicks" Mobility   Outcome Measure  Help needed turning from your back to your side while in a flat bed without using bedrails?: None Help needed moving from lying on your back to sitting on the side of a flat bed without using bedrails?: A Lot Help needed moving to and from a bed to a chair (including a wheelchair)?: A Lot Help needed standing up from a chair using your arms (e.g., wheelchair or bedside chair)?: A Lot Help needed to walk in hospital room?: A Little Help needed climbing 3-5 steps with a railing? : A Lot 6 Click Score: 15    End of Session Equipment Utilized During Treatment: Gait belt Activity Tolerance: Patient tolerated treatment  well Patient left: in chair;with call bell/phone within reach;with chair alarm set   PT Visit Diagnosis: Muscle weakness (generalized) (M62.81);Unsteadiness on feet (R26.81);Difficulty in walking, not elsewhere classified (R26.2)     Time: 1130-1146 PT Time Calculation (min) (ACUTE ONLY): 16 min  Charges:  $Therapeutic Activity: 8-22 mins                     Horald Chestnut, PT    Delford Field 05/13/2019, 1:49 PM

## 2019-05-13 NOTE — Plan of Care (Addendum)
  Problem: Clinical Measurements: Goal: Will remain free from infection Outcome: Progressing Goal: Diagnostic test results will improve Outcome: Progressing Goal: Respiratory complications will improve Outcome: Progressing Goal: Cardiovascular complication will be avoided Outcome: Progressing   Problem: Activity: Goal: Risk for activity intolerance will decrease Outcome: Progressing   Problem: Coping: Goal: Level of anxiety will decrease Outcome: Progressing   Problem: Elimination: Goal: Will not experience complications related to bowel motility Outcome: Progressing Goal: Will not experience complications related to urinary retention Outcome: Progressing   Problem: Pain Managment: Goal: General experience of comfort will improve Outcome: Progressing   Problem: Safety: Goal: Ability to remain free from injury will improve Outcome: Progressing   Problem: Skin Integrity: Goal: Risk for impaired skin integrity will decrease Outcome: Progressing   

## 2019-05-13 NOTE — Progress Notes (Signed)
ANTICOAGULATION CONSULT NOTE - Follow Up Consult  Pharmacy Consult for Warfarin Indication: atrial fibrillation  No Known Allergies  Patient Measurements: Height: 6' (182.9 cm) Weight: 170 lb (77.1 kg) IBW/kg (Calculated) : 77.6  Vital Signs: Temp: 98.2 F (36.8 C) (11/20 0333) Temp Source: Oral (11/20 0333) BP: 122/61 (11/20 0333) Pulse Rate: 108 (11/20 0333)  Labs: Recent Labs    05/11/19 0150 05/12/19 0312 05/13/19 0024  HGB  --   --  10.9*  HCT  --   --  34.1*  PLT  --   --  261  LABPROT 26.4* 27.1* 24.1*  INR 2.4* 2.5* 2.2*  CREATININE 1.49*  --  1.40*    Estimated Creatinine Clearance: 38.2 mL/min (A) (by C-G formula based on SCr of 1.4 mg/dL (H)).   Medications:  Scheduled:  . ARIPiprazole  10 mg Oral QHS  . aspirin EC  81 mg Oral Daily  . capsaicin   Topical BID  . chlorpheniramine-HYDROcodone  5 mL Oral Q12H  . feeding supplement (ENSURE ENLIVE)  237 mL Oral BID BM  . megestrol  400 mg Oral Daily  . metoprolol succinate  75 mg Oral Daily  . pantoprazole  40 mg Oral Daily  . rosuvastatin  20 mg Oral q1800  . vitamin C  500 mg Oral Daily  . Warfarin - Pharmacist Dosing Inpatient   Does not apply q1800  . zinc sulfate  220 mg Oral Daily   Infusions:    Assessment: 89 yoM on PTA warfarin 5mg  daily for Afib, resumed inpatient. Doses held from11/7-11/11 - likely due to zero oral intake. Have been using smaller doses past few days.   INR remains therapeutic at 2.2. Hgb 10.9, plt 261.  No meal intake charted. No documented bleeding in the chart.   Goal of Therapy:  INR 2-3 Monitor platelets by anticoagulation protocol: Yes   Plan:  Warfarin 1 mg PO today Monitor daily INR, CBC, s/s of bleed  Antonietta Jewel, PharmD, [place] Clinical Pharmacist  Phone: [phone] 05/13/2019 8:51 AM

## 2019-05-13 NOTE — Discharge Instructions (Signed)
Follow with Primary MD Melony Overly, MD after discharge from SNF  Get CBC, CMP, checked by PCP upon discharge  Activity: As tolerated with Full fall precautions use walker/cane & assistance as needed   Disposition SNF   Diet: Regular diet, encouraged to increase oral and fluid intake, and offer supplements frequently  On your next visit with your primary care physician please Get Medicines reviewed and adjusted.   Please request your Prim.MD to go over all Hospital Tests and Procedure/Radiological results at the follow up, please get all Hospital records sent to your Prim MD by signing hospital release before you go home.   If you experience worsening of your admission symptoms, develop shortness of breath, life threatening emergency, suicidal or homicidal thoughts you must seek medical attention immediately by calling 911 or calling your MD immediately  if symptoms less severe.  You Must read complete instructions/literature along with all the possible adverse reactions/side effects for all the Medicines you take and that have been prescribed to you. Take any new Medicines after you have completely understood and accpet all the possible adverse reactions/side effects.   Do not drive, operating heavy machinery, perform activities at heights, swimming or participation in water activities or provide baby sitting services if your were admitted for syncope or siezures until you have seen by Primary MD or a Neurologist and advised to do so again.  Do not drive when taking Pain medications.    Do not take more than prescribed Pain, Sleep and Anxiety Medications  Special Instructions: If you have smoked or chewed Tobacco  in the last 2 yrs please stop smoking, stop any regular Alcohol  and or any Recreational drug use.  Wear Seat belts while driving.   Please note  You were cared for by a hospitalist during your hospital stay. If you have any questions about your discharge  medications or the care you received while you were in the hospital after you are discharged, you can call the unit and asked to speak with the hospitalist on call if the hospitalist that took care of you is not available. Once you are discharged, your primary care physician will handle any further medical issues. Please note that NO REFILLS for any discharge medications will be authorized once you are discharged, as it is imperative that you return to your primary care physician (or establish a relationship with a primary care physician if you do not have one) for your aftercare needs so that they can reassess your need for medications and monitor your lab values.

## 2019-05-13 NOTE — TOC Transition Note (Signed)
Transition of Care [facility]) - CM/SW Discharge Note   Patient Details  Name: Marcus Quinn MRN: [id number] Date of Birth: 1926/10/02  [facility] CM/SW Contact:  Weston Anna, LCSW Phone Number: 05/13/2019, 1:05 PM   Clinical Narrative:     Patient set to discharge to [facility] for today- please call report to [phone]. [facility] called for transportation. CSW notified cousin, [name], and he is aware of discharge.         Patient Goals and CMS Choice        Discharge Placement                       Discharge Plan and Services                                     Social Determinants of Health (SDOH) Interventions     Readmission Risk Interventions No flowsheet data found.

## 2019-05-13 NOTE — Discharge Summary (Signed)
Marcus Quinn, is a 85 y.o. male  DOB 1927/06/19  MRN [id number].  Admission date:  04/27/2019  Admitting Physician  Briscoe Deutscherimothy S Opyd, MD  Discharge Date:  05/13/2019   Primary MD  Doreene Elandhomas, Millard B, MD  Recommendations for primary care physician for things to follow:  -Please check CBC, CMP in 3 days. -Continue to monitor INR and adjust warfarin dosing per facility, warfarin dose has been lowered to 1 mg p.o. nightly, this dose as needed. -Please encourage oral intake, occluding fluid intake, and he is on regular diet prove his appetite, and offer nutritional supplements frequently. -Please consider palliative care consult if patient's appetite does not improve, or clinical condition worsens.  CODE STATUS: DNR    Admission Diagnosis  COVID-19 [U07.1]   Discharge Diagnosis  COVID-19 [U07.1]    Principal Problem:   Acute CHF (congestive heart failure) (HCC) Active Problems:   Pneumonia due to COVID-19 virus   Atrial fibrillation, chronic (HCC)   Aortic stenosis      History reviewed. No pertinent past medical history.  History reviewed. No pertinent surgical history.     History of present illness and  Hospital Course:     Kindly see H&P for history of present illness and admission details, please review complete Labs, Consult reports and Test reports for all details in brief  HPI  from the history and physical done on the day of admission 04/27/2019  HPI: Marcus ManJohn Rhue is a 85 y.o. male with medical history significant of chronic atrial fibrillation, dyslipidemia, coronary artery disease, and aortic stenosis status post TAVR.  He was diagnosed with COVID-09 February 2019, apparently patient was admitted to the hospital and discharged to skilled nursing facility.  He reports being at home for about a week and a half, feeling fatigued and very tired.  Positive dyspnea.  He usually uses a cane for  ambulation.  He lives alone, noted progressive and rapid decline in his functional physical capacity.  Home health agency evaluated him on the day of admission found him very weak and deconditioned and refer him to the hospital for further evaluation. On direct questioning patient admits having moderate dyspnea, persistent, worse with exertion, no improving factors, associated with lower extremity edema and decreased physical functional capacity.  Denies any fevers and chills.  ED Course: Further work-up in the emergency department revealed oxygen saturation 80% on room air, CT angiography of his chest negative for pulmonary embolism or significant infiltrates, positive leukocytosis and positive SARS COVID-19 PCR  Hospital Course   85 year old male past medical history of chronic atrial fibrillation, aortic stenosis(spTAVR),dyslipidemia, coronary artery disease.Recently diagnosed with COVID-09 February 2019,apparently he has been at a skilled nursing facility and then discharged home. At his home he lives alone, he experienced a rapid decline in his physical functional capacity associated with dyspnea and lower extremity edema. CT chest with bilateral pleural effusions,more on theleft. Abnormal appearance of the gallbladder.Possible acute cholecystitis. Patient was admitted to the hospital with working diagnosisofright heart failure,acute on chronic, in the setting  of positive COVID-19 PCR.  Acute on chronic diastolic CHF Patient had bilateral pleural effusion on CT scan.  He was diuresed with Lasix.  He is off of diuretics due to poor oral intake.  Volume status is stable.  No evidence of volume overload, actually he needs to be encouraged to increase his fluid intake.  COVID-19 infection Initially positive in August.  Also noted to be positive again prior to this admission. Repeat COVID-19 test was was negative on 11/18, indications for any treatment currently.  Poor oral intake  Patient with very poor oral intake here in the hospital.  Patient was started on megestrol.  Patient's oral intake has remained poor despite this.  Need constant encouragement to increase his oral intake. -He was started on Megace. - Even though he is noted to have cholelithiasis, that seems to be asymptomatic at this time.  And probably not contributing to his poor appetite.    Chronic atrial fibrillation/with mild RVR Heart rate is stable.  He occasionally has tachycardia.  Continue metoprolol.  He is on warfarin for anticoagulation.  INR is therapeutic.  Thyroid function tests were normal in October on care everywhere.   Chronic kidney disease stage IIIa/hypokalemia Renal function is stable.  Potassium is improved.  Monitor urine output.  Dyslipidemia Continue statin.  History of dementia Stable  Cholelithiasis Ultrasound showed gallstones with sludge and wall thickening.  Concern for chronic cholecystitis.  However patient asymptomatic.  Abdomen remains benign  Family communication Patient's power of attorney is Hall Busing who lives in [place].  Hall Busing is the husband of patient's cousin.  He is also patient's healthcare power of attorney.  Patient's has another cousin called Jill Poling who lives in [place].     Goals of care Patient's prognosis remains guarded.  All he is extremely frail, with poor appetite, failure to thrive, cussed with Margaretmary Lombard, our current goals is the if appetite and mobility improved at the facility, but patient has clinical deterioration , then palliative consult would be a properly to discuss goals of care, he remains DNR at time of discharge.  Discharge Condition:  Stable at time        Discharge Instructions  and  Discharge Medications    Discharge Instructions    Discharge instructions   Complete by: As directed    Follow with Primary MD Doreene Eland, MD after discharge from SNF  Get CBC,  CMP, checked by PCP upon discharge  Activity: As tolerated with Full fall precautions use walker/cane & assistance as needed   Disposition SNF   Diet: Regular diet, encouraged to increase oral and fluid intake, and offer supplements frequently  On your next visit with your primary care physician please Get Medicines reviewed and adjusted.   Please request your Prim.MD to go over all Hospital Tests and Procedure/Radiological results at the follow up, please get all Hospital records sent to your Prim MD by signing hospital release before you go home.   If you experience worsening of your admission symptoms, develop shortness of breath, life threatening emergency, suicidal or homicidal thoughts you must seek medical attention immediately by calling 911 or calling your MD immediately  if symptoms less severe.  You Must read complete instructions/literature along with all the possible adverse reactions/side effects for all the Medicines you take and that have been prescribed to you. Take any new Medicines after you have completely understood and accpet all the possible adverse reactions/side effects.   Do  not drive, operating heavy machinery, perform activities at heights, swimming or participation in water activities or provide baby sitting services if your were admitted for syncope or siezures until you have seen by Primary MD or a Neurologist and advised to do so again.  Do not drive when taking Pain medications.    Do not take more than prescribed Pain, Sleep and Anxiety Medications  Special Instructions: If you have smoked or chewed Tobacco  in the last 2 yrs please stop smoking, stop any regular Alcohol  and or any Recreational drug use.  Wear Seat belts while driving.   Please note  You were cared for by a hospitalist during your hospital stay. If you have any questions about your discharge medications or the care you received while you were in the hospital after you are  discharged, you can call the unit and asked to speak with the hospitalist on call if the hospitalist that took care of you is not available. Once you are discharged, your primary care physician will handle any further medical issues. Please note that NO REFILLS for any discharge medications will be authorized once you are discharged, as it is imperative that you return to your primary care physician (or establish a relationship with a primary care physician if you do not have one) for your aftercare needs so that they can reassess your need for medications and monitor your lab values.   Increase activity slowly   Complete by: As directed    Increase activity slowly   Complete by: As directed      Allergies as of 05/13/2019   No Known Allergies     Medication List    STOP taking these medications   amoxicillin-clavulanate 875-125 MG tablet Commonly known as: AUGMENTIN   aspirin EC 81 MG tablet   furosemide 20 MG tablet Commonly known as: LASIX   potassium chloride SA 20 MEQ tablet Commonly known as: KLOR-CON   traMADol 50 MG tablet Commonly known as: ULTRAM     TAKE these medications   ARIPiprazole 10 MG tablet Commonly known as: ABILIFY Take 10 mg by mouth at bedtime.   feeding supplement (ENSURE ENLIVE) Liqd Take 237 mLs by mouth 2 (two) times daily between meals.   gabapentin 300 MG capsule Commonly known as: NEURONTIN Take 100 mg by mouth 3 (three) times daily.   hyoscyamine 0.375 MG 12 hr tablet Commonly known as: LEVBID Take 0.375 mg by mouth every 12 (twelve) hours as needed for cramping (diarrhea).   megestrol 400 MG/10ML suspension Commonly known as: MEGACE Take 10 mLs (400 mg total) by mouth daily.   Melatonin 3 MG Tabs Take 3 mg by mouth at bedtime.   metoprolol succinate 50 MG 24 hr tablet Commonly known as: TOPROL-XL Take 50 mg by mouth daily.   multivitamin-iron-minerals-folic acid Tabs tablet Take 1 tablet by mouth daily.   pantoprazole 40 MG  tablet Commonly known as: PROTONIX Take 40 mg by mouth daily.   Potassium Chloride ER 20 MEQ Tbcr Take 20 mEq by mouth daily.   rosuvastatin 20 MG tablet Commonly known as: CRESTOR Take 20 mg by mouth daily.   saccharomyces boulardii 250 MG capsule Commonly known as: FLORASTOR Take 250 mg by mouth 2 (two) times daily.   sennosides-docusate sodium 8.6-50 MG tablet Commonly known as: SENOKOT-S Take 2 tablets by mouth at bedtime as needed for constipation.   vitamin C 500 MG tablet Commonly known as: ASCORBIC ACID Take 500 mg by mouth daily.  warfarin 1 MG tablet Commonly known as: COUMADIN Take 1 tablet (1 mg total) by mouth one time only at 6 PM. What changed:   medication strength  how much to take  when to take this   zinc sulfate 220 (50 Zn) MG capsule Take 220 mg by mouth daily.         Diet and Activity recommendation: See Discharge Instructions above   Consults obtained -   None  Major procedures and Radiology Reports - PLEASE review detailed and final reports for all details, in brief -      Dg Chest 1 View  Result Date: 04/27/2019 CLINICAL DATA:  Shortness of breath. EXAM: CHEST  1 VIEW COMPARISON:  04/26/2019 FINDINGS: The cardiac silhouette, mediastinal and hilar contours are stable. There is tortuosity and calcification of the thoracic aorta. Stable surgical changes from triple bypass surgery and aortic valve repair. Low lung volumes with streaky atelectasis and mild vascular crowding. No definite infiltrates or effusions. The bony thorax is intact. IMPRESSION: Chronic lung changes.  No acute pulmonary findings. Electronically Signed   By: Marijo Sanes M.D.   On: 04/27/2019 17:22   US Abdomen Limited Ruq  Result Date: 04/28/2019 CLINICAL DATA:  Gallstones EXAM: ULTRASOUND ABDOMEN LIMITED RIGHT UPPER QUADRANT COMPARISON:  04/26/2019 FINDINGS: Gallbladder: Stones measuring up to 1 cm. Sludge. Marked wall thickening at 17 mm with pericholecystic  fluid. Sonographer unable to evaluate for [place]ercell Miller secondary to physical condition. Common bile duct: Diameter: 4.2 mm Liver: No focal lesion identified. Within normal limits in parenchymal echogenicity. Poor visibility of the left lobe. Focal fluid collection along the anterior edge of the liver measuring 4 x 3.9 by 1.3 cm portal vein is patent on color Doppler imaging with normal direction of blood flow towards the liver. Other: Incidental note made of right pleural effusion. IMPRESSION: 1. Gallstones with sludge and marked wall thickening up to 17 mm in addition to pericholecystic fluid. Sonographic Percell Miller unable to be assessed secondary to patient condition. Findings are similar as compared with 04/26/2019 and may reflect acute or chronic cholecystitis; nuclear medicine hepatobiliary imaging could be performed to assess for acute disease. 2. Right pleural effusion 3. Focal fluid collection at the anterior edge of liver, possible small amount of loculated ascites. Electronically Signed   By: Donavan Foil M.D.   On: 04/28/2019 18:26    Micro Results     Recent Results (from the past 240 hour(s))  SARS CORONAVIRUS 2 (TAT 6-24 HRS) Nasopharyngeal Nasopharyngeal Swab     Status: None   Collection Time: 05/10/19  9:51 AM   Specimen: Nasopharyngeal Swab  Result Value Ref Range Status   SARS Coronavirus 2 NEGATIVE NEGATIVE Final    Comment: (NOTE) SARS-CoV-2 target nucleic acids are NOT DETECTED. The SARS-CoV-2 RNA is generally detectable in upper and lower respiratory specimens during the acute phase of infection. Negative results do not preclude SARS-CoV-2 infection, do not rule out co-infections with other pathogens, and should not be used as the sole basis for treatment or other patient management decisions. Negative results must be combined with clinical observations, patient history, and epidemiological information. The expected result is Negative. Fact Sheet for Patients:  SugarRoll.be Fact Sheet for Healthcare Providers: https://www.woods-mathews.com/ This test is not yet approved or cleared by the [place] FDA and  has been authorized for detection and/or diagnosis of SARS-CoV-2 by FDA under an Emergency Use Authorization (EUA). This EUA will remain  in effect (meaning this test can be used) for the duration  of the COVID-19 declaration under Section 56 4(b)(1) of the Act, 21 U.S.C. section 360bbb-3(b)(1), unless the authorization is terminated or revoked sooner. Performed at [facility], [address], [place], [place] [address]        Today   Subjective:   Phat Dalton today himself denies any complaints, reports her appetite has been improving, reports he able to eat some potato with breakfast this morning , and finished his milk , he is eager for discharge, as he reports he spent long time here .  Objective:   Blood pressure 108/72, pulse (!) 110, temperature 98.5 F (36.9 C), temperature source Axillary, resp. rate 18, height 6' (1.829 m), weight 77.1 kg, SpO2 98 %.   Intake/Output Summary (Last 24 hours) at 05/13/2019 1210 Last data filed at 05/13/2019 0600 Gross per 24 hour  Intake 450 ml  Output 500 ml  Net -50 ml    Exam Awake Alert, Oriented x 3, but he is easily distracted , extremely frail .no new F.N deficits, Normal affect Symmetrical Chest wall movement, Good air movement bilaterally, CTAB RRR,No Gallops,Rubs or new Murmurs, No Parasternal Heave +ve B.Sounds, Abd Soft, Non tender,No rebound -guarding or rigidity. No Cyanosis, Clubbing or edema, No new Rash or bruise  Data Review   CBC w Diff:  Lab Results  Component Value Date   WBC 11.7 (H) 05/13/2019   HGB 10.9 (L) 05/13/2019   HCT 34.1 (L) 05/13/2019   PLT 261 05/13/2019    CMP:  Lab Results  Component Value Date   NA 141 05/13/2019   K 4.0 05/13/2019   CL 105 05/13/2019   CO2 24 05/13/2019   BUN 17  05/13/2019   CREATININE 1.40 (H) 05/13/2019   PROT 8.0 05/11/2019   ALBUMIN 2.7 (L) 05/11/2019   BILITOT 1.0 05/11/2019   ALKPHOS 186 (H) 05/11/2019   AST 36 05/11/2019   ALT 31 05/11/2019  .   Total Time in preparing paper work, data evaluation and todays exam - 35 minutes  Huey Bienenstock M.D on 05/13/2019 at 12:10 PM  [facility]   Office  [phone]

## 2019-05-13 NOTE — Progress Notes (Signed)
Called and spoke to [place] at [facility] and gave her report.

## 2019-05-13 NOTE — NC FL2 (Signed)
[place] MEDICAID FL2 LEVEL OF CARE SCREENING TOOL     IDENTIFICATION  Patient Name: Marcus Quinn Birthdate: 1927-03-17 Sex: male Admission Date (Current Location): 04/27/2019  [facility] and [place] Number:  Producer, television/film/video and Address:  [facility], [address] [place], [place] [address]      Provider Number: [id number]  Attending Physician Name and Address:  Elgergawy, Leana Roe, MD  Relative Name and Phone Number:       Current Level of Care: Hospital Recommended Level of Care: Skilled Nursing Facility Prior Approval Number:    Date Approved/Denied:   PASRR Number: [id number]  Discharge Plan: SNF    Current Diagnoses: Patient Active Problem List   Diagnosis Date Noted  . Pneumonia due to COVID-19 virus 04/27/2019  . Atrial fibrillation, chronic (HCC) 04/27/2019  . Aortic stenosis 04/27/2019  . Acute CHF (congestive heart failure) (HCC) 04/27/2019    Orientation RESPIRATION BLADDER Height & Weight     Self  Normal External catheter Weight: 170 lb (77.1 kg) Height:  6' (182.9 cm)  BEHAVIORAL SYMPTOMS/MOOD NEUROLOGICAL BOWEL NUTRITION STATUS        Diet(See dc summary)  AMBULATORY STATUS COMMUNICATION OF NEEDS Skin     Verbally Normal                       Personal Care Assistance Level of Assistance  Bathing, Dressing, Feeding Bathing Assistance: Limited assistance Feeding assistance: Independent Dressing Assistance: Limited assistance     Functional Limitations Info             SPECIAL CARE FACTORS FREQUENCY  PT (By licensed PT), OT (By licensed OT)     PT Frequency: 5 OT Frequency: 5            Contractures      Additional Factors Info  Code Status, Allergies Code Status Info: DNR Allergies Info: NKA           Current Medications (05/13/2019):  This is the current hospital active medication list Current Facility-Administered Medications  Medication Dose Route Frequency Provider Last Rate Last  Dose  . acetaminophen (TYLENOL) tablet 650 mg  650 mg Oral Q6H PRN Arrien, York Ram, MD   650 mg at 05/11/19 1029   Or  . acetaminophen (TYLENOL) suppository 650 mg  650 mg Rectal Q6H PRN Arrien, York Ram, MD      . ARIPiprazole (ABILIFY) tablet 10 mg  10 mg Oral QHS Coralie Keens, MD   10 mg at 05/12/19 2207  . aspirin EC tablet 81 mg  81 mg Oral Daily Arrien, York Ram, MD   81 mg at 05/13/19 0959  . capsaicin (ZOSTRIX) 0.025 % cream   Topical BID Osvaldo Shipper, MD      . chlorpheniramine-HYDROcodone (TUSSIONEX) 10-8 MG/5ML suspension 5 mL  5 mL Oral Q12H Arrien, York Ram, MD   5 mL at 05/09/19 1106  . feeding supplement (ENSURE ENLIVE) (ENSURE ENLIVE) liquid 237 mL  237 mL Oral BID BM Haydee Monica, MD   237 mL at 05/12/19 0914  . guaiFENesin-dextromethorphan (ROBITUSSIN DM) 100-10 MG/5ML syrup 10 mL  10 mL Oral Q4H PRN Arrien, York Ram, MD      . hydrALAZINE (APRESOLINE) injection 10 mg  10 mg Intravenous Q6H PRN Haydee Monica, MD   10 mg at 04/30/19 3295  . megestrol (MEGACE) 400 MG/10ML suspension 400 mg  400 mg Oral Daily Osvaldo Shipper, MD   400  mg at 05/10/19 1108  . metoprolol succinate (TOPROL-XL) 24 hr tablet 75 mg  75 mg Oral Daily Arrien, Jimmy Picket, MD   75 mg at 05/13/19 0959  . ondansetron (ZOFRAN) tablet 4 mg  4 mg Oral Q6H PRN Arrien, Jimmy Picket, MD       Or  . ondansetron [facility]) injection 4 mg  4 mg Intravenous Q6H PRN Arrien, Jimmy Picket, MD      . pantoprazole (PROTONIX) EC tablet 40 mg  40 mg Oral Daily Arrien, Jimmy Picket, MD   40 mg at 05/12/19 0914  . rosuvastatin (CRESTOR) tablet 20 mg  20 mg Oral q1800 Arrien, Jimmy Picket, MD   20 mg at 05/12/19 1742  . vitamin C (ASCORBIC ACID) tablet 500 mg  500 mg Oral Daily Arrien, Jimmy Picket, MD   500 mg at 05/13/19 1002  . warfarin (COUMADIN) tablet 1 mg  1 mg Oral ONCE-1800 Elgergawy, Silver Huguenin, MD      . Warfarin - Pharmacist Dosing Inpatient   Does  not apply q1800 Randa Spike, RPH      . zinc sulfate capsule 220 mg  220 mg Oral Daily Arrien, Jimmy Picket, MD   220 mg at 05/12/19 8206     Discharge Medications: Please see discharge summary for a list of discharge medications.  Relevant Imaging Results:  Relevant Lab Results:   Additional Information [id number]  Weston Anna, LCSW

## 2019-07-25 DEATH — deceased

## 2020-03-10 IMAGING — US US ABDOMEN LIMITED
1 series · 13 of 25 positions shown · non-contrast
Comparison: 04/26/2019

CLINICAL DATA: Gallstones

EXAM:
ULTRASOUND ABDOMEN LIMITED RIGHT UPPER QUADRANT

[Series 1: us abdomen limited · 0.19mm/px · 13 of 100 slices shown]
[im 1/100]
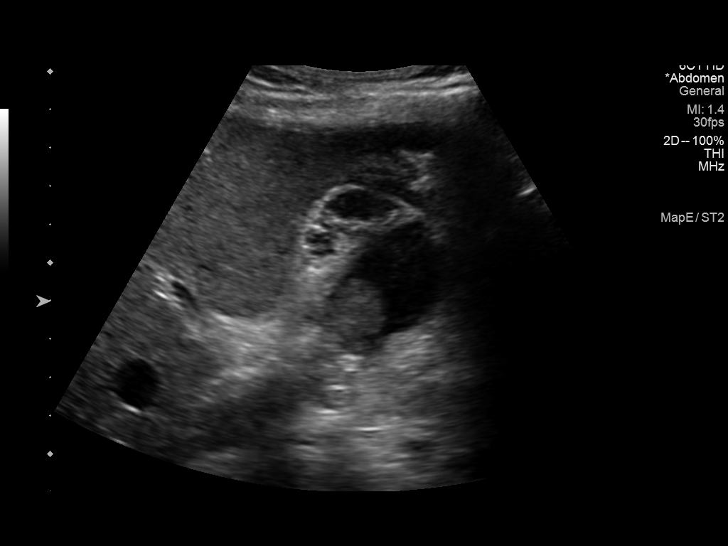
[im 9/100]
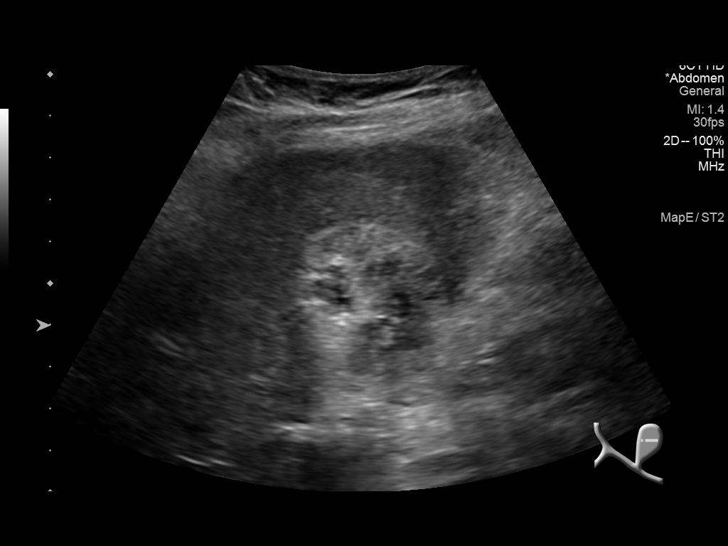
[im 17/100]
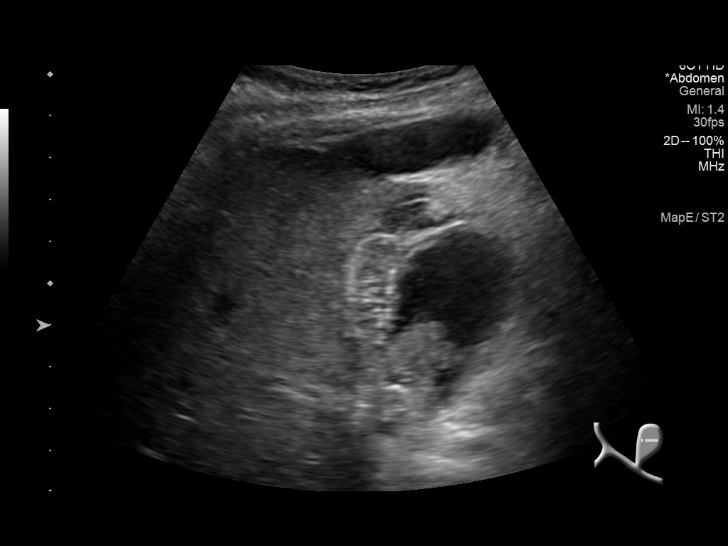
[im 25/100]
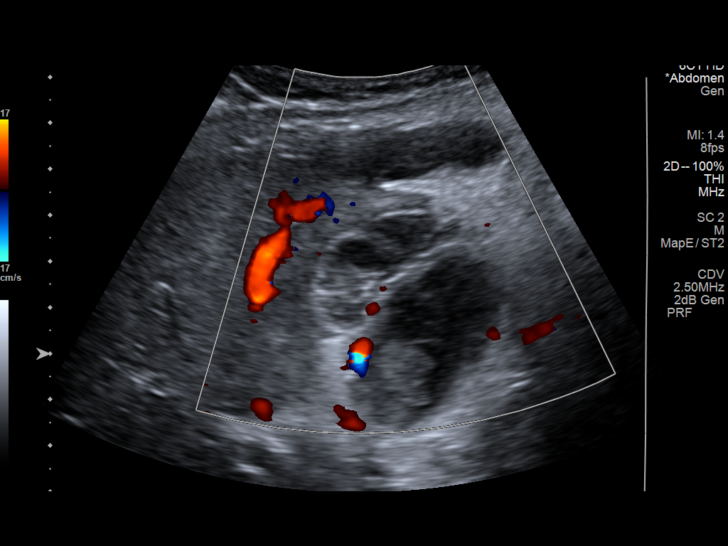
[im 34/100]
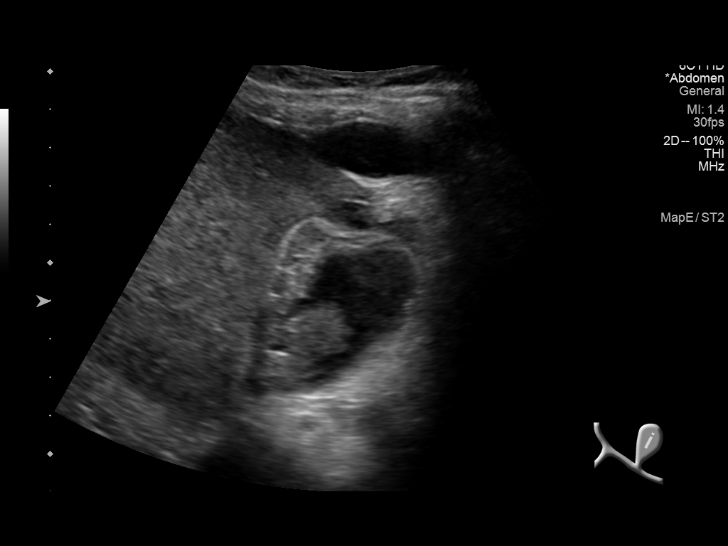
[im 42/100]
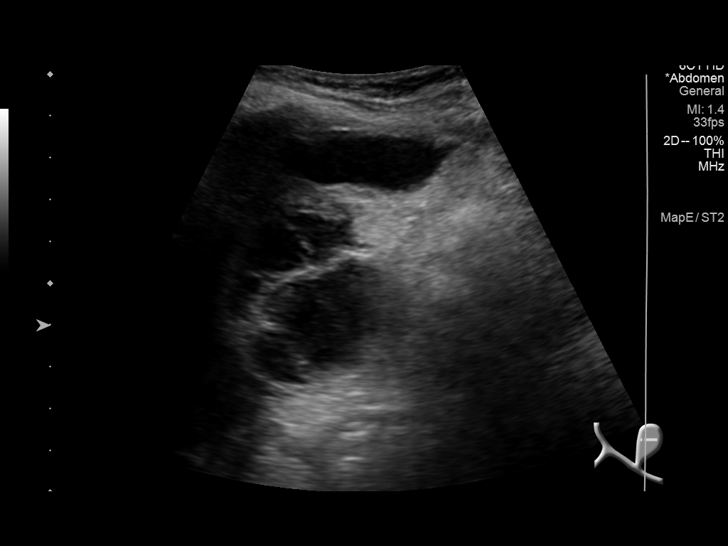
[im 50/100]
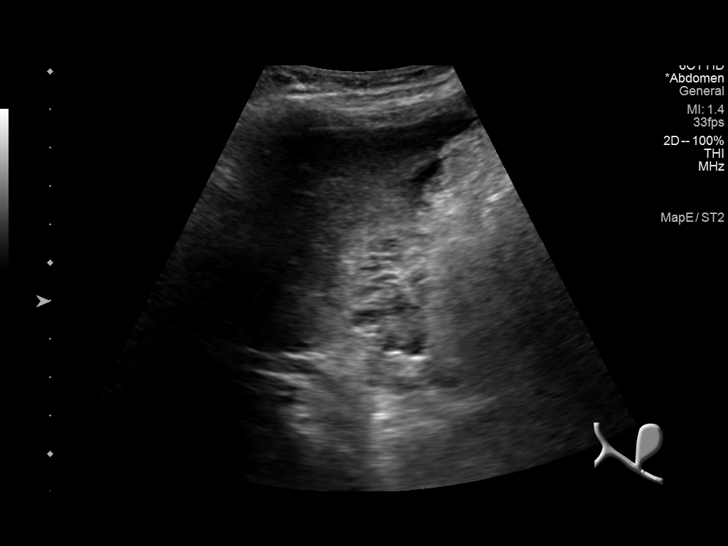
[im 58/100]
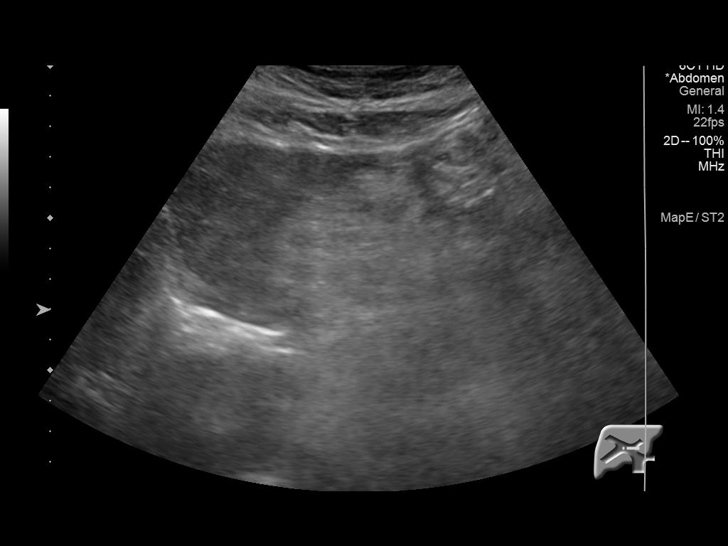
[im 67/100]
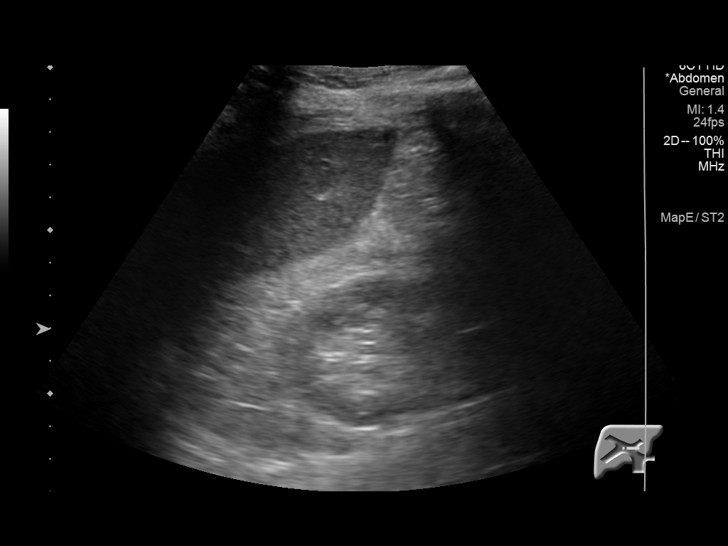
[im 75/100]
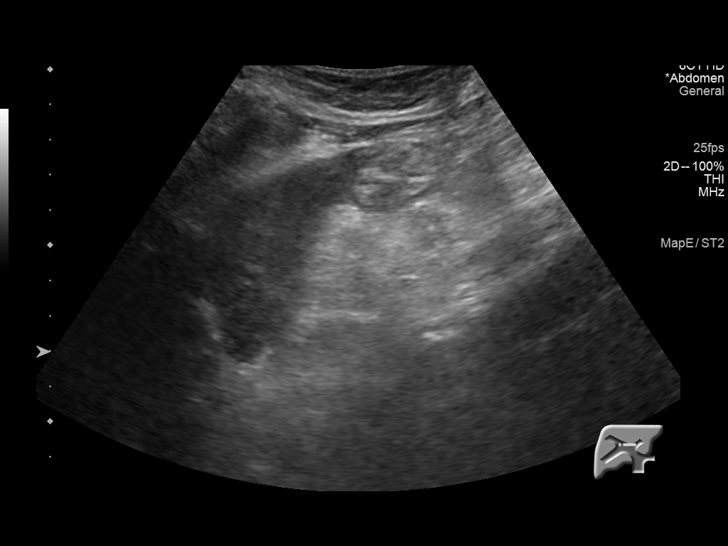
[im 83/100]
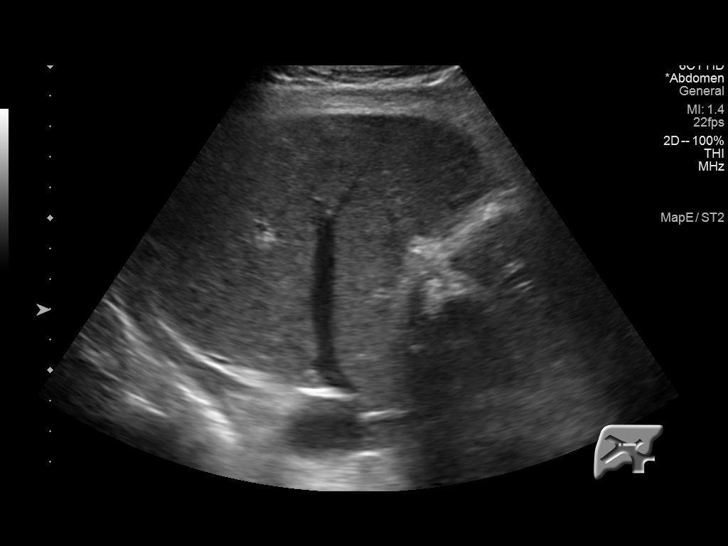
[im 91/100]
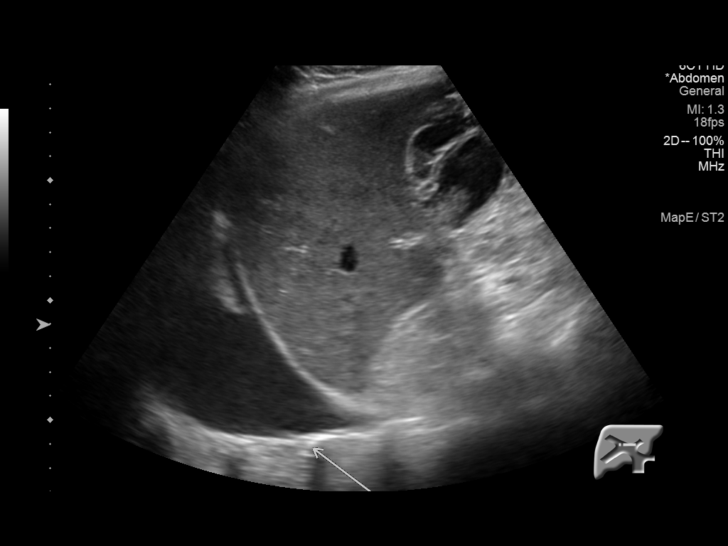
[im 100/100]
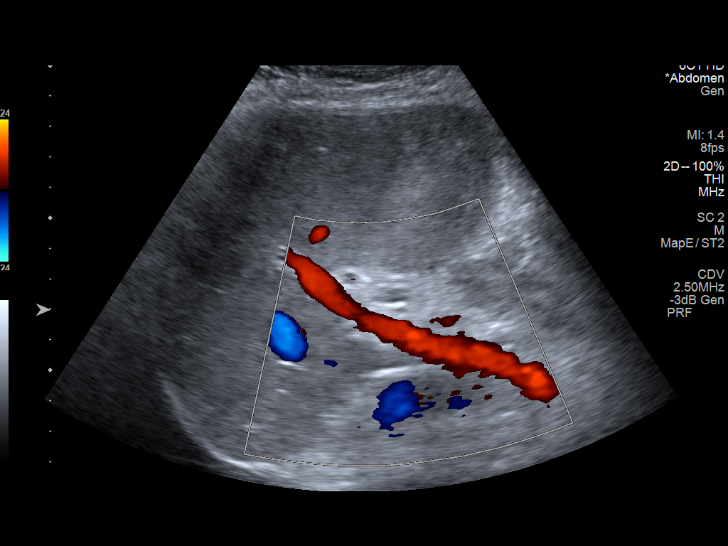

[13 of 25 positions shown; findings below may reference images not displayed]

FINDINGS: Gallbladder:

Stones measuring up to 1 cm. Sludge. Marked wall thickening at 17 mm
with pericholecystic fluid. Sonographer unable to evaluate for
[place] secondary to physical condition.

Common bile duct:

Diameter: 4.2 mm

Liver:

No focal lesion identified. Within normal limits in parenchymal
echogenicity. Poor visibility of the left lobe. Focal fluid
collection along the anterior edge of the liver measuring 4 x 3.9 by
1.3 cm portal vein is patent on color Doppler imaging with normal
direction of blood flow towards the liver.

Other: Incidental note made of right pleural effusion.
IMPRESSION: 1. Gallstones with sludge and marked wall thickening up to 17 mm in
addition to pericholecystic fluid. Sonographic Murphy unable to be
assessed secondary to patient condition. Findings are similar as
compared with 04/26/2019 and may reflect acute or chronic
cholecystitis; nuclear medicine hepatobiliary imaging could be
performed to assess for acute disease.
2. Right pleural effusion
3. Focal fluid collection at the anterior edge of liver, possible
small amount of loculated ascites.
